# Patient Record
Sex: Female | Born: 1970
Health system: Southern US, Community
[De-identification: ages and names within clinical notes are randomized; demographics above are authoritative.]

## PROBLEM LIST (undated history)

## (undated) DIAGNOSIS — I10 Essential (primary) hypertension: Secondary | ICD-10-CM

## (undated) HISTORY — DX: Essential (primary) hypertension: I10

---

## 2013-03-26 LAB — LIPID PANEL
CHOLESTEROL: 200 mg/dL (ref 0–200)
HDL: 80 mg/dL — AB (ref 35–70)
LDL Cholesterol: 101 mg/dL
LDl/HDL Ratio: 2.5
Triglycerides: 95 mg/dL (ref 40–160)

## 2013-03-26 LAB — BASIC METABOLIC PANEL
BUN: 7 mg/dL (ref 4–21)
Creatinine: 0.8 mg/dL (ref <=1.1)
GLUCOSE: 92 mg/dL
Potassium: 4.2 mmol/L (ref 3.4–5.3)
SODIUM: 141 mmol/L (ref 137–147)

## 2013-03-26 LAB — CBC AND DIFFERENTIAL
HEMATOCRIT: 40 % (ref 36–46)
HEMOGLOBIN: 13.2 g/dL (ref 12.0–16.0)
PLATELETS: 262 10*3/uL (ref 150–399)
WBC: 3.6 10^3/mL

## 2014-01-08 ENCOUNTER — Telehealth: Payer: Self-pay | Admitting: *Deleted

## 2014-01-08 NOTE — Telephone Encounter (Signed)
Medical records received and forwarded to Jess T. JG//CMA

## 2014-01-12 ENCOUNTER — Encounter: Payer: Self-pay | Admitting: General Practice

## 2014-01-14 ENCOUNTER — Ambulatory Visit: Payer: 59 | Admitting: Family Medicine

## 2014-01-21 ENCOUNTER — Encounter: Payer: Self-pay | Admitting: Family Medicine

## 2014-01-21 ENCOUNTER — Ambulatory Visit (INDEPENDENT_AMBULATORY_CARE_PROVIDER_SITE_OTHER): Payer: 59 | Admitting: Family Medicine

## 2014-01-21 ENCOUNTER — Encounter: Payer: Self-pay | Admitting: General Practice

## 2014-01-21 VITALS — BP 170/100 | HR 94 | Temp 98.1°F | Resp 16 | Ht 62.0 in | Wt 111.0 lb

## 2014-01-21 DIAGNOSIS — I10 Essential (primary) hypertension: Secondary | ICD-10-CM

## 2014-01-21 LAB — BASIC METABOLIC PANEL
BUN: 8 mg/dL (ref 6–23)
CO2: 26 mEq/L (ref 19–32)
CREATININE: 0.8 mg/dL (ref 0.4–1.2)
Calcium: 9.6 mg/dL (ref 8.4–10.5)
Chloride: 105 mEq/L (ref 96–112)
GFR: 79.36 mL/min (ref >60.00)
Glucose, Bld: 103 mg/dL — ABNORMAL HIGH (ref 70–99)
Potassium: 4.6 mEq/L (ref 3.5–5.1)
Sodium: 139 mEq/L (ref 135–145)

## 2014-01-21 LAB — CBC WITH DIFFERENTIAL/PLATELET
BASOS ABS: 0 10*3/uL (ref 0.0–0.1)
Basophils Relative: 0.5 % (ref 0.0–3.0)
EOS ABS: 0 10*3/uL (ref 0.0–0.7)
Eosinophils Relative: 0.2 % (ref 0.0–5.0)
HEMATOCRIT: 40.9 % (ref 36.0–46.0)
HEMOGLOBIN: 13.4 g/dL (ref 12.0–15.0)
LYMPHS ABS: 1.6 10*3/uL (ref 0.7–4.0)
Lymphocytes Relative: 24.4 % (ref 12.0–46.0)
MCHC: 32.7 g/dL (ref 30.0–36.0)
MCV: 92.4 fl (ref 78.0–100.0)
MONOS PCT: 7.8 % (ref 3.0–12.0)
Monocytes Absolute: 0.5 10*3/uL (ref 0.1–1.0)
NEUTROS ABS: 4.3 10*3/uL (ref 1.4–7.7)
Neutrophils Relative %: 67.1 % (ref 43.0–77.0)
PLATELETS: 276 10*3/uL (ref 150.0–400.0)
RBC: 4.43 Mil/uL (ref 3.87–5.11)
RDW: 13.7 % (ref 11.5–15.5)
WBC: 6.4 10*3/uL (ref 4.0–10.5)

## 2014-01-21 LAB — TSH: TSH: 1.16 u[IU]/mL (ref 0.35–4.50)

## 2014-01-21 MED ORDER — METOPROLOL SUCCINATE ER 25 MG PO TB24: 25.0000 mg | ORAL_TABLET | Freq: Every day | ORAL | Status: DC

## 2014-01-21 MED ORDER — LOSARTAN POTASSIUM 50 MG PO TABS: 50.0000 mg | ORAL_TABLET | Freq: Every day | ORAL | Status: DC

## 2014-01-21 NOTE — Patient Instructions (Signed)
Follow up in 2-3 weeks to recheck BP We'll notify you of your lab results and make any changes if needed STOP the atenolol START the Losartan and Metoprolol daily Drink plenty of fluids Limit salt intake Try and exercise as able Call with any questions or concerns Welcome!  We're glad to have you!

## 2014-01-21 NOTE — Progress Notes (Signed)
Pre visit review using our clinic review tool, if applicable. No additional management support is needed unless otherwise documented below in the visit note. 

## 2014-01-21 NOTE — Progress Notes (Signed)
   Subjective:    Patient ID: Lisa Patterson, female    DOB: [birth year], 43 y.o.   MRN: [id number]  HPI New to establish.  Previous- Lisa Patterson.  HTN- chronic problem, on atenolol.  Has been checking BP at home and readings have been 'very high'.  Yesterday afternoon developed blurry vision and HA on R side.  BP was '150 something over 103'.  + dizziness.  Laid down- took 2nd dose of atenolol.  sxs improved after sleep.  + family hx of HTN.  No CP, SOB.  Intermittent edema of hands.  No dietary changes.  Pt reports she no longer exercises.  Previously on Metoprolol and Losartan   Review of Systems For ROS see HPI     Objective:   Physical Exam  Constitutional: She is oriented to person, place, and time. She appears well-developed and well-nourished. No distress.  HENT:  Head: Normocephalic and atraumatic.  Eyes: Conjunctivae and EOM are normal. Pupils are equal, round, and reactive to light.  Neck: Normal range of motion. Neck supple. No thyromegaly present.  Cardiovascular: Normal rate, regular rhythm, normal heart sounds and intact distal pulses.   No murmur heard. Pulmonary/Chest: Effort normal and breath sounds normal. No respiratory distress.  Abdominal: Soft. She exhibits no distension. There is no tenderness.  Musculoskeletal: She exhibits no edema.  Lymphadenopathy:    She has no cervical adenopathy.  Neurological: She is alert and oriented to person, place, and time.  Skin: Skin is warm and dry.  Psychiatric: She has a normal mood and affect. Her behavior is normal.  Vitals reviewed.         Assessment & Plan:

## 2014-01-24 NOTE — Assessment & Plan Note (Signed)
New to provider, ongoing for pt.  BP is not well controlled on current regimen of Atenolol.  Stop Atenolol and restart previous regimen of Metoprolol and Losartan.  Check labs to assess for baseline Cr, possible anemia, hyperthyroid.   Reviewed supportive care and red flags that should prompt return.  Pt expressed understanding and is in agreement w/ plan.

## 2014-02-03 ENCOUNTER — Ambulatory Visit: Payer: 59 | Admitting: Family Medicine

## 2014-02-08 ENCOUNTER — Ambulatory Visit (INDEPENDENT_AMBULATORY_CARE_PROVIDER_SITE_OTHER): Payer: 59 | Admitting: Medical

## 2014-02-08 ENCOUNTER — Encounter: Payer: Self-pay | Admitting: Medical

## 2014-02-08 VITALS — BP 138/80 | HR 97 | Temp 98.1°F | Ht 61.0 in | Wt 110.8 lb

## 2014-02-08 DIAGNOSIS — B001 Herpesviral vesicular dermatitis: Secondary | ICD-10-CM | POA: Insufficient documentation

## 2014-02-08 DIAGNOSIS — I10 Essential (primary) hypertension: Secondary | ICD-10-CM

## 2014-02-08 MED ORDER — FAMCICLOVIR 500 MG PO TABS: 500.0000 mg | ORAL_TABLET | Freq: Three times a day (TID) | ORAL | Status: DC

## 2014-02-08 NOTE — Progress Notes (Signed)
   Subjective:    Patient ID: Lisa Patterson, female    DOB: 1970/07/04, 43 y.o.   MRN: [id number]  HPI   Pt in for rt lower lip swollen. This morning she had slight small itch to her lip. Then this am very swollen lip. Usually she does not get this degree of  swelling. No shortness or breath or wheezing.  Pt also has high blood pressure history. Pt just on metoprolol succinate and losartan. Pt pressure still little high side. At home readings are 153/98, 147/96. Little above 140/90. No cardiac or neurologic signs or symptoms recently.   LMP- November 28 th,2015.    Review of Systems  Constitutional: Negative for fever, chills, diaphoresis, activity change and fatigue.  Respiratory: Negative for cough, chest tightness and shortness of breath.   Cardiovascular: Negative for chest pain, palpitations and leg swelling.  Gastrointestinal: Negative for nausea, vomiting and abdominal pain.  Musculoskeletal: Negative for neck pain and neck stiffness.  Skin:       Rt lower lip mild swollen. Cold sore.  Neurological: Negative for dizziness, tremors, seizures, syncope, facial asymmetry, speech difficulty, weakness, light-headedness, numbness and headaches.  Psychiatric/Behavioral: Negative for behavioral problems, confusion and agitation. The patient is not nervous/anxious.        Objective:   Physical Exam   General Mental Status- Alert. General Appearance- Not in acute distress.   Skin General: Color- Normal Color. Moisture- Normal Moisture. Rt side of lower lip- moderate swollen. 8mm scab. No tenderness to palpation  Neck Carotid Arteries- Normal color. Moisture- Normal Moisture. No carotid bruits. No JVD.  Chest and Lung Exam Auscultation: Breath Sounds:-Normal.  Cardiovascular Auscultation:Rythm- Regular. Murmurs & Other Heart Sounds:Auscultation of the heart reveals- No Murmurs.    Neurologic Cranial Nerve exam:- CN III-XII intact(No nystagmus), symmetric smile. Romberg  Exam:- Negative.  Heal to Toe Gait exam:-Normal. Strength:- 5/5 equal and symmetric strength both upper and lower extremities.       Assessment & Plan:

## 2014-02-08 NOTE — Patient Instructions (Signed)
Rx famvir today for large cold sore.  For your blood pressure. It was better when I checked compared to your at home readings. I want you to check your bp and pulse. Give us update later this week on both. If both little high then I may increase the metoprolol.Follow up in 7 days or as needed  Cold Sore A cold sore (fever blister) is a skin infection caused by the herpes simplex virus (HSV-1). HSV-1 is closely related to the virus that causes genital herpes (HSV-2), but they are not the same even though both viruses can cause oral and genital infections. Cold sores are small, fluid-filled sores inside of the mouth or on the lips, gums, nose, chin, cheeks, or fingers.  The herpes simplex virus can be easily passed (contagious) to other people through close personal contact, such as kissing or sharing personal items. The virus can also spread to other parts of the body, such as the eyes or genitals. Cold sores are contagious until the sores crust over completely. They often heal within 2 weeks.  Once a person is infected, the herpes simplex virus remains permanently in the body. Therefore, there is no cure for cold sores, and they often recur when a person is tired, stressed, sick, or gets too much sun. Additional factors that can cause a recurrence include hormone changes in menstruation or pregnancy, certain drugs, and cold weather.  CAUSES  Cold sores are caused by the herpes simplex virus. The virus is spread from person to person through close contact, such as through kissing, touching the affected area, or sharing personal items such as lip balm, razors, or eating utensils.  SYMPTOMS  The first infection may not cause symptoms. If symptoms develop, the symptoms often go through different stages. Here is how a cold sore develops:   Tingling, itching, or burning is felt 1-2 days before the outbreak.   Fluid-filled blisters appear on the lips, inside the mouth, nose, or on the cheeks.   The  blisters start to ooze clear fluid.   The blisters dry up and a yellow crust appears in its place.   The crust falls off.  Symptoms depend on whether it is the initial outbreak or a recurrence. Some other symptoms with the first outbreak may include:   Fever.   Sore throat.   Headache.   Muscle aches.   Swollen neck glands.  DIAGNOSIS  A diagnosis is often made based on your symptoms and looking at the sores. Sometimes, a sore may be swabbed and then examined in the lab to make a final diagnosis. If the sores are not present, blood tests can find the herpes simplex virus.  TREATMENT  There is no cure for cold sores and no vaccine for the herpes simplex virus. Within 2 weeks, most cold sores go away on their own without treatment. Medicines cannot make the infection go away, but medicine can help relieve some of the pain associated with the sores, can work to stop the virus from multiplying, and can also shorten healing time. Medicine may be in the form of creams, gels, pills, or a shot.  HOME CARE INSTRUCTIONS   Only take over-the-counter or prescription medicines for pain, discomfort, or fever as directed by your caregiver. Do not use aspirin.   Use a cotton-tip swab to apply creams or gels to your sores.   Do not touch the sores or pick the scabs. Wash your hands often. Do not touch your eyes without washing your hands  first.   Avoid kissing, oral sex, and sharing personal items until sores heal.   Apply an ice pack on your sores for 10-15 minutes to ease any discomfort.   Avoid hot, cold, or salty foods because they may hurt your mouth. Eat a soft, bland diet to avoid irritating the sores. Use a straw to drink if you have pain when drinking out of a glass.   Keep sores clean and dry to prevent an infection of other tissues.   Avoid the sun and limit stress if these things trigger outbreaks. If sun causes cold sores, apply sunscreen on the lips before being out in  the sun.  SEEK MEDICAL CARE IF:   You have a fever or persistent symptoms for more than 2-3 days.   You have a fever and your symptoms suddenly get worse.   You have pus, not clear fluid, coming from the sores.   You have redness that is spreading.   You have pain or irritation in your eye.   You get sores on your genitals.   Your sores do not heal within 2 weeks.   You have a weakened immune system.   You have frequent recurrences of cold sores.  MAKE SURE YOU:   Understand these instructions.  Will watch your condition.  Will get help right away if you are not doing well or get worse. Document Released: 02/17/2000 Document Revised: 07/06/2013 Document Reviewed: 07/04/2011 [facility] Patient Information 2015 [place]. This information is not intended to replace advice given to you by your health care provider. Make sure you discuss any questions you have with your health care provider.

## 2014-02-08 NOTE — Assessment & Plan Note (Signed)
For your blood pressure. It was better when I checked compared to your at home readings. I want you to check your bp and pulse. Give us update later this week on both. If both little high then I may increase the metoprolol.Follow up in 7 days or as needed

## 2014-02-08 NOTE — Assessment & Plan Note (Signed)
Rx famvir 500 mg tid x 7 days today for large cold sore.

## 2014-02-08 NOTE — Progress Notes (Signed)
Pre visit review using our clinic review tool, if applicable. No additional management support is needed unless otherwise documented below in the visit note. 

## 2014-02-10 ENCOUNTER — Ambulatory Visit (INDEPENDENT_AMBULATORY_CARE_PROVIDER_SITE_OTHER): Payer: 59 | Admitting: Family Medicine

## 2014-02-10 ENCOUNTER — Encounter: Payer: Self-pay | Admitting: Family Medicine

## 2014-02-10 VITALS — BP 136/84 | HR 87 | Resp 16 | Wt 111.1 lb

## 2014-02-10 DIAGNOSIS — I10 Essential (primary) hypertension: Secondary | ICD-10-CM

## 2014-02-10 MED ORDER — LOSARTAN POTASSIUM 100 MG PO TABS: 100.0000 mg | ORAL_TABLET | Freq: Every day | ORAL | Status: DC

## 2014-02-10 NOTE — Assessment & Plan Note (Signed)
Improved since starting meds at last visit.  Pt is now asymptomatic but continues to have elevated diastolic readings.  Increase losartan to 100mg  daily.  Recheck BMP today due to addition of ARB.  Pt expressed understanding and is in agreement w/ plan.

## 2014-02-10 NOTE — Patient Instructions (Signed)
Follow up in 1 month to recheck BP Increase the Losartan to 100mg - 2 tabs of what you have, 1 of the new prescription when you pick it up Continue the Metoprolol daily We'll notify you of your lab results and make any changes if needed Call with any questions or concerns Happy Holidays!!!

## 2014-02-10 NOTE — Progress Notes (Signed)
   Subjective:    Patient ID: Lisa Patterson, female    DOB: [birth year], 43 y.o.   MRN: [id number]  HPI HTN- much better today than last visit.  No longer having blurry vision, HAs, dizziness.  No CP, SOB, edema.  Home BPs ranging 125-151/90-103.     Review of Systems For ROS see HPI     Objective:   Physical Exam  Constitutional: She is oriented to person, place, and time. She appears well-developed and well-nourished. No distress.  HENT:  Head: Normocephalic and atraumatic.  Eyes: Conjunctivae and EOM are normal. Pupils are equal, round, and reactive to light.  Neck: Normal range of motion. Neck supple. No thyromegaly present.  Cardiovascular: Normal rate, regular rhythm, normal heart sounds and intact distal pulses.   No murmur heard. Pulmonary/Chest: Effort normal and breath sounds normal. No respiratory distress.  Abdominal: Soft. She exhibits no distension. There is no tenderness.  Musculoskeletal: She exhibits no edema.  Lymphadenopathy:    She has no cervical adenopathy.  Neurological: She is alert and oriented to person, place, and time.  Skin: Skin is warm and dry.  Psychiatric: She has a normal mood and affect. Her behavior is normal.  Vitals reviewed.         Assessment & Plan:

## 2014-02-10 NOTE — Progress Notes (Signed)
Pre visit review using our clinic review tool, if applicable. No additional management support is needed unless otherwise documented below in the visit note. 

## 2014-02-11 ENCOUNTER — Encounter: Payer: Self-pay | Admitting: General Practice

## 2014-02-11 LAB — BASIC METABOLIC PANEL
BUN: 8 mg/dL (ref 6–23)
CALCIUM: 9 mg/dL (ref 8.4–10.5)
CO2: 25 meq/L (ref 19–32)
CREATININE: 0.8 mg/dL (ref 0.4–1.2)
Chloride: 104 mEq/L (ref 96–112)
GFR: 89.05 mL/min (ref >60.00)
Glucose, Bld: 95 mg/dL (ref 70–99)
Potassium: 3.8 mEq/L (ref 3.5–5.1)
SODIUM: 136 meq/L (ref 135–145)

## 2014-02-12 ENCOUNTER — Encounter: Payer: Self-pay | Admitting: General Practice

## 2014-02-22 ENCOUNTER — Telehealth: Payer: Self-pay | Admitting: Family Medicine

## 2014-02-22 MED ORDER — METOPROLOL SUCCINATE ER 25 MG PO TB24: 25.0000 mg | ORAL_TABLET | Freq: Every day | ORAL | Status: DC

## 2014-02-22 NOTE — Telephone Encounter (Signed)
Med filled.  

## 2014-02-22 NOTE — Telephone Encounter (Signed)
metroprolol 25 mg refill to [facility]

## 2014-03-30 ENCOUNTER — Encounter: Payer: Self-pay | Admitting: Medical

## 2014-03-30 ENCOUNTER — Ambulatory Visit (INDEPENDENT_AMBULATORY_CARE_PROVIDER_SITE_OTHER): Payer: 59 | Admitting: Medical

## 2014-03-30 VITALS — BP 132/97 | HR 80 | Temp 98.3°F | Ht 61.0 in | Wt 112.4 lb

## 2014-03-30 DIAGNOSIS — J069 Acute upper respiratory infection, unspecified: Secondary | ICD-10-CM

## 2014-03-30 MED ORDER — AZITHROMYCIN 250 MG PO TABS: ORAL_TABLET | ORAL | Status: DC

## 2014-03-30 MED ORDER — HYDROCODONE-HOMATROPINE 5-1.5 MG/5ML PO SYRP: 5.0000 mL | ORAL_SOLUTION | Freq: Three times a day (TID) | ORAL | Status: DC | PRN

## 2014-03-30 MED ORDER — FLUTICASONE PROPIONATE 50 MCG/ACT NA SUSP: 2.0000 | Freq: Every day | NASAL | Status: DC

## 2014-03-30 NOTE — Patient Instructions (Addendum)
You appear to have upper respiratory infection with possible early sinusitis. I am prescribing hydromet for cough and flonase for nasal congestion. If you sinus pressure persists or bronchitis type symptoms occur then start azithromycin.  Follow up 7 days or as needed.

## 2014-03-30 NOTE — Assessment & Plan Note (Signed)
You appear to have upper respiratory infection with possible early sinusitis. I am prescribing hydromet for cough and flonase for nasal congestion. If you sinus pressure persists or bronchitis type symptoms occur then start azithromycin.  Follow up 7 days or as needed. 

## 2014-03-30 NOTE — Progress Notes (Signed)
Subjective:    Patient ID: Lisa Patterson, female    DOB: 26-Sep-1970, 44 y.o.   MRN: [id number]  HPI   Pt in today reporting cough, nasal congestion and runny nose for  3 Days. Moderate symptoms to severe first 2 days. Today she felt like she was getting better. 1st day body aches. Faint now.  Symptoms started Sunday at night. On review symptoms do not feel flu like per pt.  Associated symptoms( below yes or no)  Fever-no Chills-yes Chest congestion-No Sneezing- yes Itching eyes-no Sore throat- yes Post-nasal drainage-yes Wheezing-No Purulent  Nasal drainage-yes Fatigue-yes  Faint sinus pressure. No ear pain.  Tylenol sinus otc.  Past Medical History  Diagnosis Date  . Hypertension     History   Social History  . Marital Status: Married    Spouse Name: N/A    Number of Children: N/A  . Years of Education: N/A   Occupational History  . Not on file.   Social History Main Topics  . Smoking status: Never Smoker   . Smokeless tobacco: Not on file  . Alcohol Use: Not on file  . Drug Use: Not on file  . Sexual Activity: Not on file   Other Topics Concern  . Not on file   Social History Narrative    No past surgical history on file.  Family History  Problem Relation Age of Onset  . Mental illness Mother   . Hyperlipidemia Father   . Hypertension Father   . Diabetes Father   . Cancer Brother     lung  . Cancer Maternal Aunt     breast  . Diabetes Maternal Grandmother   . Cancer Maternal Grandfather     lung  . Diabetes Paternal Grandmother     No Known Allergies  Current Outpatient Prescriptions on File Prior to Visit  Medication Sig Dispense Refill  . ASPIRIN EC PO Take by mouth.    . losartan (COZAAR) 100 MG tablet Take 1 tablet (100 mg total) by mouth daily. 90 tablet 3  . metoprolol succinate (TOPROL-XL) 25 MG 24 hr tablet Take 1 tablet (25 mg total) by mouth daily. 30 tablet 3  . famciclovir (FAMVIR) 500 MG tablet Take 1 tablet (500 mg  total) by mouth 3 (three) times daily. (Patient not taking: Reported on 03/30/2014) 21 tablet 0   No current facility-administered medications on file prior to visit.    BP 132/97 mmHg  Pulse 80  Temp(Src) 98.3 F (36.8 C) (Oral)  Ht 5\' 1"  (1.549 m)  Wt 112 lb 6.4 oz (50.984 kg)  BMI 21.25 kg/m2  SpO2 99%  LMP 02/22/2014        Review of Systems  Constitutional: Positive for chills. Negative for fever and fatigue.  HENT: Positive for congestion, sinus pressure and sneezing. Negative for sore throat.        Sinus pressure. Minimal faint sinus pressure.  Respiratory: Positive for cough. Negative for apnea, chest tightness, shortness of breath and wheezing.   Cardiovascular: Negative for chest pain and palpitations.  Gastrointestinal: Negative for abdominal pain.  Genitourinary: Negative for dysuria, frequency, flank pain and dyspareunia.  Musculoskeletal: Negative for back pain, arthralgias and neck pain.  Neurological: Negative for headaches.  Hematological: Negative for adenopathy. Does not bruise/bleed easily.       Objective:   Physical Exam   General  Mental Status - Alert. General Appearance - Well groomed. Not in acute distress.  Skin Rashes- No Rashes.  HEENT Head- Normal.  Ear Auditory Canal - Left- Normal. Right - Normal.Tympanic Membrane- Left- Normal. Right- Normal. Eye Sclera/Conjunctiva- Left- Normal. Right- Normal. Nose & Sinuses Nasal Mucosa- Left-  Boggy and Congested. Right-  Boggy and  Congested.Bilateral maxillary and frontal sinus pressure. Mouth & Throat Lips: Upper Lip- Normal: no dryness, cracking, pallor, cyanosis, or vesicular eruption. Lower Lip-Normal: no dryness, cracking, pallor, cyanosis or vesicular eruption. Buccal Mucosa- Bilateral- No Aphthous ulcers. Oropharynx- No Discharge or Erythema. Tonsils: Characteristics- Bilateral- No Erythema or Congestion. Size/Enlargement- Bilateral- No enlargement. Discharge-  bilateral-None.  Neck Neck- Supple. No Masses.   Chest and Lung Exam Auscultation: Breath Sounds:-Clear even and unlabored.  Cardiovascular Auscultation:Rythm- Regular, rate and rhythm. Murmurs & Other Heart Sounds:Ausculatation of the heart reveal- No Murmurs.  Lymphatic Head & Neck General Head & Neck Lymphatics: Bilateral: Description- No Localized lymphadenopathy.         Assessment & Plan:

## 2014-03-30 NOTE — Progress Notes (Signed)
Pre visit review using our clinic review tool, if applicable. No additional management support is needed unless otherwise documented below in the visit note. 

## 2014-04-08 ENCOUNTER — Other Ambulatory Visit: Payer: Self-pay | Admitting: Family Medicine

## 2014-04-08 ENCOUNTER — Ambulatory Visit (INDEPENDENT_AMBULATORY_CARE_PROVIDER_SITE_OTHER): Payer: 59 | Admitting: Family Medicine

## 2014-04-08 ENCOUNTER — Encounter: Payer: Self-pay | Admitting: Family Medicine

## 2014-04-08 VITALS — BP 122/80 | HR 88 | Temp 98.1°F | Resp 16 | Wt 112.1 lb

## 2014-04-08 DIAGNOSIS — N63 Unspecified lump in breast: Secondary | ICD-10-CM

## 2014-04-08 DIAGNOSIS — N6310 Unspecified lump in the right breast, unspecified quadrant: Secondary | ICD-10-CM | POA: Insufficient documentation

## 2014-04-08 NOTE — Assessment & Plan Note (Signed)
New.  Mass has been present x6-8 weeks w/o fluctuating in size.  + TTP.  Get imaging to assess.  Pt expressed understanding and is in agreement w/ plan.

## 2014-04-08 NOTE — Progress Notes (Signed)
   Subjective:    Patient ID: Lisa Patterson, female    DOB: 02-Nov-1970, 44 y.o.   MRN: [id number]  HPI Breast mass- pt felt a lump in the R breast.  Had felt it previously but it always disappeared after menses.  Pt reports it has now been present x6-8 weeks.  Area is uncomfortable and worse during menses.  Maternal Aunt w/ breast cancer.  No masses in L.   Review of Systems For ROS see HPI     Objective:   Physical Exam  Constitutional: She appears well-developed and well-nourished. No distress.  Pulmonary/Chest: Right breast exhibits mass (2 cm mass at areolar margin between 12-1 o'clock). Right breast exhibits no inverted nipple, no nipple discharge, no skin change and no tenderness. Left breast exhibits no inverted nipple, no mass, no nipple discharge, no skin change and no tenderness.    Vitals reviewed.         Assessment & Plan:

## 2014-04-08 NOTE — Progress Notes (Signed)
Pre visit review using our clinic review tool, if applicable. No additional management support is needed unless otherwise documented below in the visit note. 

## 2014-04-08 NOTE — Patient Instructions (Signed)
We'll call you with your mammo appt Try not to stress- we will figure this out! Call with any questions or concerns Hang in there!

## 2014-04-15 ENCOUNTER — Ambulatory Visit
Admission: RE | Admit: 2014-04-15 | Discharge: 2014-04-15 | Disposition: A | Discharge status: Home / Self Care | Payer: 59 | Source: Ambulatory Visit | Attending: Family Medicine | Admitting: Family Medicine

## 2014-04-15 DIAGNOSIS — N6310 Unspecified lump in the right breast, unspecified quadrant: Secondary | ICD-10-CM

## 2014-06-07 ENCOUNTER — Telehealth: Payer: Self-pay | Admitting: Family Medicine

## 2014-06-07 MED ORDER — LOSARTAN POTASSIUM 100 MG PO TABS: 100.0000 mg | ORAL_TABLET | Freq: Every day | ORAL | Status: DC

## 2014-06-07 MED ORDER — METOPROLOL SUCCINATE ER 25 MG PO TB24: 25.0000 mg | ORAL_TABLET | Freq: Every day | ORAL | Status: DC

## 2014-06-07 NOTE — Addendum Note (Signed)
Addended by: Jackson LatinoYLER, JESSICA L on: 06/07/2014 01:04 PM   Modules accepted: Orders

## 2014-06-07 NOTE — Telephone Encounter (Signed)
Meds filled

## 2014-06-07 NOTE — Telephone Encounter (Signed)
Valentina LucksMatos, Jazmaine 12-01-14 Pt states she needs a refill on her Losartan and Metoprolol meds.  [facility] uses [facility] Pt can be reached at [phone]    [facility] [address] [employer] [address]

## 2014-06-15 ENCOUNTER — Ambulatory Visit (INDEPENDENT_AMBULATORY_CARE_PROVIDER_SITE_OTHER): Payer: 59 | Admitting: Physician Assistant

## 2014-06-15 ENCOUNTER — Encounter: Payer: Self-pay | Admitting: Physician Assistant

## 2014-06-15 VITALS — BP 130/78 | HR 82 | Temp 98.1°F | Resp 12

## 2014-06-15 DIAGNOSIS — I10 Essential (primary) hypertension: Secondary | ICD-10-CM

## 2014-06-15 DIAGNOSIS — R209 Unspecified disturbances of skin sensation: Secondary | ICD-10-CM | POA: Diagnosis not present

## 2014-06-15 NOTE — Assessment & Plan Note (Signed)
With endorsed swelling. None visible on exam.  Etiology unclear -- does not seem consistent with worrisome dx like bell palsy. Seems more likely first symptoms of cold sore or slight allergic reaction.  Supportive measures discussed. Avoid potential allergens discussed.  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

## 2014-06-15 NOTE — Progress Notes (Signed)
Patient presents to clinic today c/o some tingling at corner of lip this morning associated with mild puffiness.  Denies change to lotion, soaps or detergents.  Did have a small headache yesterday that has resolved.  Is being treated for HTN with losartan and Toprol-Xl daily.  Endorses taking medication as directed. Patient denies chest pain, palpitations, lightheadedness, dizziness, vision changes. Has been well controlled on current regimen for quite some time. 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   Past Medical History  Diagnosis Date  . Hypertension     Current Outpatient Prescriptions on File Prior to Visit  Medication Sig Dispense Refill  . ASPIRIN EC PO Take by mouth.    Marland Kitchen azithromycin (ZITHROMAX) 250 MG tablet Take 2 tablets by mouth on day 1, followed by 1 tablet by mouth daily for 4 days. 6 tablet 0  . famciclovir (FAMVIR) 500 MG tablet Take 1 tablet (500 mg total) by mouth 3 (three) times daily. 21 tablet 0  . fluticasone (FLONASE) 50 MCG/ACT nasal spray Place 2 sprays into both nostrils daily. 16 g 1  . HYDROcodone-homatropine (HYCODAN) 5-1.5 MG/5ML syrup Take 5 mLs by mouth every 8 (eight) hours as needed for cough. 120 mL 0  . losartan (COZAAR) 100 MG tablet Take 1 tablet (100 mg total) by mouth daily. 90 tablet 1  . metoprolol succinate (TOPROL-XL) 25 MG 24 hr tablet Take 1 tablet (25 mg total) by mouth daily. 90 tablet 1   No current facility-administered medications on file prior to visit.    No Known Allergies  Family History  Problem Relation Age of Onset  . Mental illness Mother   . Hyperlipidemia Father   . Hypertension Father   . Diabetes Father   . Cancer Brother     lung  . Cancer Maternal Aunt     breast  . Diabetes Maternal Grandmother   . Cancer Maternal Grandfather     lung  . Diabetes Paternal Grandmother     History   Social History  . Marital Status: Married    Spouse Name: N/A  . Number of Children: N/A  . Years of  Education: N/A   Social History Main Topics  . Smoking status: Never Smoker   . Smokeless tobacco: Not on file  . Alcohol Use: Not on file  . Drug Use: Not on file  . Sexual Activity: Not on file   Other Topics Concern  . None   Social History Narrative   Review of Systems - See HPI.  All other ROS are negative.  BP 130/78 mmHg  Pulse 82  Temp(Src) 98.1 F (36.7 C) (Oral)  Resp 12  SpO2 97%  Physical Exam  Constitutional: She is oriented to person, place, and time and well-developed, well-nourished, and in no distress.  HENT:  Head: Normocephalic and atraumatic.  Right Ear: External ear normal.  Left Ear: External ear normal.  Nose: Nose normal.  Mouth/Throat: Oropharynx is clear and moist. No oropharyngeal exudate.  TM within normal limits bilaterally.  Eyes: Conjunctivae are normal.  Neck: Neck supple.  Cardiovascular: Normal rate, regular rhythm, normal heart sounds and intact distal pulses.   Pulmonary/Chest: Effort normal and breath sounds normal. No respiratory distress. She has no wheezes. She has no rales. She exhibits no tenderness.  Neurological: She is alert and oriented to person, place, and time.  Skin: Skin is warm and dry. No rash noted.  Psychiatric: Affect normal.  Vitals reviewed.  Assessment/Plan: Sensation disturbance of  skin With endorsed swelling. None visible on exam.  Etiology unclear -- does not seem consistent with worrisome dx like bell palsy. Seems more likely first symptoms of cold sore or slight allergic reaction.  Supportive measures discussed. Avoid potential allergens discussed.  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   HTN (hypertension) BP normotensive in clinic.  Do not think swelling noted this am was related to ARB.  Encouraged her to hold ARB in the morning before reassessment.

## 2014-06-15 NOTE — Patient Instructions (Signed)
Your exam looked good today.  Only minimal swelling noted.  Really watch what you are eating and lotions you are using to make sure there is not a developing allergic reaction.  Also there is a small chance that the losartan could be contributing. I would encourage you to hold off your losartan tomorrow morning and we will reassess your symptoms tomorrow.

## 2014-06-15 NOTE — Assessment & Plan Note (Signed)
BP normotensive in clinic.  Do not think swelling noted this am was related to ARB.  Encouraged her to hold ARB in the morning before reassessment.

## 2014-11-02 ENCOUNTER — Encounter: Payer: Self-pay | Admitting: Family Medicine

## 2014-11-02 ENCOUNTER — Ambulatory Visit (INDEPENDENT_AMBULATORY_CARE_PROVIDER_SITE_OTHER): Payer: 59 | Admitting: Family Medicine

## 2014-11-02 VITALS — BP 124/80 | HR 83 | Temp 98.0°F | Resp 16 | Ht 61.0 in | Wt 115.2 lb

## 2014-11-02 DIAGNOSIS — T783XXA Angioneurotic edema, initial encounter: Secondary | ICD-10-CM | POA: Diagnosis not present

## 2014-11-02 DIAGNOSIS — Z0000 Encounter for general adult medical examination without abnormal findings: Secondary | ICD-10-CM | POA: Diagnosis not present

## 2014-11-02 DIAGNOSIS — I10 Essential (primary) hypertension: Secondary | ICD-10-CM | POA: Diagnosis not present

## 2014-11-02 DIAGNOSIS — Z23 Encounter for immunization: Secondary | ICD-10-CM

## 2014-11-02 DIAGNOSIS — Z01419 Encounter for gynecological examination (general) (routine) without abnormal findings: Secondary | ICD-10-CM

## 2014-11-02 LAB — HEPATIC FUNCTION PANEL
ALK PHOS: 71 U/L (ref 39–117)
ALT: 9 U/L (ref 0–35)
AST: 14 U/L (ref 0–37)
Albumin: 4.3 g/dL (ref 3.5–5.2)
BILIRUBIN TOTAL: 1.4 mg/dL — AB (ref 0.2–1.2)
Bilirubin, Direct: 0.2 mg/dL (ref 0.0–0.3)
Total Protein: 7.4 g/dL (ref 6.0–8.3)

## 2014-11-02 LAB — CBC WITH DIFFERENTIAL/PLATELET
BASOS PCT: 0.5 % (ref 0.0–3.0)
Basophils Absolute: 0 10*3/uL (ref 0.0–0.1)
EOS PCT: 1.4 % (ref 0.0–5.0)
Eosinophils Absolute: 0.1 10*3/uL (ref 0.0–0.7)
HCT: 38.6 % (ref 36.0–46.0)
Hemoglobin: 12.9 g/dL (ref 12.0–15.0)
LYMPHS ABS: 1.8 10*3/uL (ref 0.7–4.0)
Lymphocytes Relative: 35.2 % (ref 12.0–46.0)
MCHC: 33.3 g/dL (ref 30.0–36.0)
MCV: 90.9 fl (ref 78.0–100.0)
MONOS PCT: 7.6 % (ref 3.0–12.0)
Monocytes Absolute: 0.4 10*3/uL (ref 0.1–1.0)
NEUTROS ABS: 2.8 10*3/uL (ref 1.4–7.7)
NEUTROS PCT: 55.3 % (ref 43.0–77.0)
PLATELETS: 244 10*3/uL (ref 150.0–400.0)
RBC: 4.24 Mil/uL (ref 3.87–5.11)
RDW: 13.4 % (ref 11.5–15.5)
WBC: 5.1 10*3/uL (ref 4.0–10.5)

## 2014-11-02 LAB — BASIC METABOLIC PANEL
BUN: 5 mg/dL — ABNORMAL LOW (ref 6–23)
CO2: 26 mEq/L (ref 19–32)
Calcium: 8.9 mg/dL (ref 8.4–10.5)
Chloride: 105 mEq/L (ref 96–112)
Creatinine, Ser: 0.66 mg/dL (ref 0.40–1.20)
GFR: 104.43 mL/min (ref >60.00)
Glucose, Bld: 85 mg/dL (ref 70–99)
POTASSIUM: 3.6 meq/L (ref 3.5–5.1)
SODIUM: 136 meq/L (ref 135–145)

## 2014-11-02 LAB — VITAMIN D 25 HYDROXY (VIT D DEFICIENCY, FRACTURES): VITD: 17.4 ng/mL — ABNORMAL LOW (ref 30.00–100.00)

## 2014-11-02 LAB — LIPID PANEL
CHOL/HDL RATIO: 3
CHOLESTEROL: 196 mg/dL (ref 0–200)
HDL: 67.8 mg/dL (ref >39.00)
LDL Cholesterol: 105 mg/dL — ABNORMAL HIGH (ref 0–99)
NonHDL: 128.2
Triglycerides: 117 mg/dL (ref 0.0–149.0)
VLDL: 23.4 mg/dL (ref 0.0–40.0)

## 2014-11-02 LAB — TSH: TSH: 1.85 u[IU]/mL (ref 0.35–4.50)

## 2014-11-02 MED ORDER — METOPROLOL SUCCINATE ER 25 MG PO TB24: 25.0000 mg | ORAL_TABLET | Freq: Every day | ORAL | Status: DC

## 2014-11-02 MED ORDER — LOSARTAN POTASSIUM 100 MG PO TABS: 100.0000 mg | ORAL_TABLET | Freq: Every day | ORAL | Status: DC

## 2014-11-02 MED ORDER — AMLODIPINE BESYLATE 5 MG PO TABS: 5.0000 mg | ORAL_TABLET | Freq: Every day | ORAL | Status: DC

## 2014-11-02 NOTE — Assessment & Plan Note (Signed)
Chronic problem.  Pt having angioedema w/ ARB use.  D/c ARB.  Start Amlodipine .  Will continue to monitor closely.  Pt expressed understanding and is in agreement w/ plan.

## 2014-11-02 NOTE — Patient Instructions (Signed)
Follow up in 3-4 weeks to recheck BP We'll notify you of your lab results and make any changes if needed STOP the Losartan Tomorrow, start the Amlodipine and continue the Metoprolol Take the Allegra tonight before bed and then hold off on daily allegra unless you are having symptoms of swelling If you have swelling and you are unable to breathe, please go to the ER immediately (call [phone]!) Call with any questions or concerns Happy Labor Day!

## 2014-11-02 NOTE — Assessment & Plan Note (Signed)
Pt's PE WNL.  UTD on mammo.  Due for pap- referral to GYN placed.  Check labs.  Anticipatory guidance provided.

## 2014-11-02 NOTE — Progress Notes (Signed)
Pre visit review using our clinic review tool, if applicable. No additional management support is needed unless otherwise documented below in the visit note. 

## 2014-11-02 NOTE — Progress Notes (Signed)
   Subjective:    Patient ID: Lisa Patterson, female    DOB: Nov 12, 1970, 44 y.o.   MRN: [id number]  HPI CPE- UTD on mammo, due for GYN- needs referral.  Pt reports she has had 5 months of intermittent facial swelling.  Occuring mostly at night.  Resolves w/ Allegra.  Takes Losartan daily in the AM.  No changes in face cream, detergents   Review of Systems Patient reports no vision/ hearing changes, adenopathy,fever, weight change,  persistant/recurrent hoarseness , swallowing issues, chest pain, palpitations, edema, persistant/recurrent cough, hemoptysis, dyspnea (rest/exertional/paroxysmal nocturnal), gastrointestinal bleeding (melena, rectal bleeding), abdominal pain, significant heartburn, bowel changes, GU symptoms (dysuria, hematuria, incontinence), Gyn symptoms (abnormal  bleeding, pain),  syncope, focal weakness, memory loss, numbness & tingling, skin/hair/nail changes, abnormal bruising or bleeding, anxiety, or depression.     Objective:   Physical Exam General Appearance:    Alert, cooperative, no distress, appears stated age  Head:    Normocephalic, without obvious abnormality, atraumatic  Eyes:    PERRL, conjunctiva/corneas clear, EOM's intact, fundi    benign, both eyes  Ears:    Normal TM's and external ear canals, both ears  Nose:   Nares normal, septum midline, mucosa normal, no drainage    or sinus tenderness  Throat:   Lips, mucosa, and tongue normal; teeth and gums normal  Neck:   Supple, symmetrical, trachea midline, no adenopathy;    Thyroid: no enlargement/tenderness/nodules  Back:     Symmetric, no curvature, ROM normal, no CVA tenderness  Lungs:     Clear to auscultation bilaterally, respirations unlabored  Chest Wall:    No tenderness or deformity   Heart:    Regular rate and rhythm, S1 and S2 normal, no murmur, rub   or gallop  Breast Exam:    Deferred to GYN  Abdomen:     Soft, non-tender, bowel sounds active all four quadrants,    no masses, no organomegaly    Genitalia:    Deferred to GYN  Rectal:    Extremities:   Extremities normal, atraumatic, no cyanosis or edema  Pulses:   2+ and symmetric all extremities  Skin:   Skin color, texture, turgor normal, no rashes or lesions  Lymph nodes:   Cervical, supraclavicular, and axillary nodes normal  Neurologic:   CNII-XII intact, normal strength, sensation and reflexes    throughout          Assessment & Plan:

## 2014-11-02 NOTE — Assessment & Plan Note (Signed)
New.  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  Pt already took ARB today so she is to take Allegra prior to bed tonight.  Pt to DC ARB and monitor for improvement in sxs.  If sxs persist, she will need referral to allergy.  Reviewed supportive care and red flags that should prompt return.  Pt expressed understanding and is in agreement w/ plan.

## 2014-11-03 ENCOUNTER — Encounter: Payer: Self-pay | Admitting: General Practice

## 2014-11-03 ENCOUNTER — Other Ambulatory Visit: Payer: Self-pay | Admitting: General Practice

## 2014-11-03 MED ORDER — VITAMIN D (ERGOCALCIFEROL) 1.25 MG (50000 UNIT) PO CAPS: 50000.0000 [IU] | ORAL_CAPSULE | ORAL | Status: DC

## 2014-11-12 ENCOUNTER — Ambulatory Visit (INDEPENDENT_AMBULATORY_CARE_PROVIDER_SITE_OTHER): Payer: 59 | Admitting: Family Medicine

## 2014-11-12 ENCOUNTER — Encounter: Payer: Self-pay | Admitting: Family Medicine

## 2014-11-12 VITALS — BP 128/80 | HR 75 | Temp 98.5°F | Wt 113.2 lb

## 2014-11-12 DIAGNOSIS — T783XXD Angioneurotic edema, subsequent encounter: Secondary | ICD-10-CM

## 2014-11-12 MED ORDER — METHYLPREDNISOLONE ACETATE 80 MG/ML IJ SUSP
80.0000 mg | Freq: Once | INTRAMUSCULAR | Status: AC
  Administered 2014-11-12: 80 mg via INTRAMUSCULAR

## 2014-11-12 MED ORDER — EPINEPHRINE 0.3 MG/0.3ML IJ SOAJ: 0.3000 mg | Freq: Once | INTRAMUSCULAR | Status: DC

## 2014-11-12 MED ORDER — PREDNISONE 10 MG PO TABS
ORAL_TABLET | ORAL | Status: DC
Start: 2014-11-12 — End: 2014-12-01

## 2014-11-12 NOTE — Assessment & Plan Note (Signed)
Seems to be worsening each time Depo medrol given in office con't allegra Allergy referral

## 2014-11-12 NOTE — Patient Instructions (Signed)
Angioedema °Angioedema is a sudden swelling of tissues, often of the skin. It can occur on the face or genitals or in the abdomen or other body parts. The swelling usually develops over a short period and gets better in 24 to 48 hours. It often begins during the night and is found when the person wakes up. The person may also get red, itchy patches of skin (hives). Angioedema can be dangerous if it involves swelling of the air passages.  °Depending on the cause, episodes of angioedema may only happen once, come back in unpredictable patterns, or repeat for several years and then gradually fade away.  °CAUSES  °Angioedema can be caused by an allergic reaction to various triggers. It can also result from nonallergic causes, including reactions to drugs, immune system disorders, viral infections, or an abnormal gene that is passed to you from your parents (hereditary). For some people with angioedema, the cause is unknown.  °Some things that can trigger angioedema include:  °· Foods.   °· Medicines, such as ACE inhibitors, ARBs, nonsteroidal anti-inflammatory agents, or estrogen.   °· Latex.   °· Animal saliva.   °· Insect stings.   °· Dyes used in X-rays.   °· Mild injury.   °· Dental work. °· Surgery. °· Stress.   °· Sudden changes in temperature.   °· Exercise. °SIGNS AND SYMPTOMS  °· Swelling of the skin. °· Hives. If these are present, there is also intense itching. °· Redness in the affected area.   °· Pain in the affected area. °· Swollen lips or tongue. °· Breathing problems. This may happen if the air passages swell. °· Wheezing. °If internal organs are involved, there may be:  °· Nausea.   °· Abdominal pain.   °· Vomiting.   °· Difficulty swallowing.   °· Difficulty passing urine. °DIAGNOSIS  °· Your health care provider will examine the affected area and take a medical and family history. °· Various tests may be done to help determine the cause. Tests may include: °¨ Allergy skin tests to see if the problem  is an allergic reaction.   °¨ Blood tests to check for hereditary angioedema.   °¨ Tests to check for underlying diseases that could cause the condition.   °· A review of your medicines, including over-the-counter medicines, may be done. °TREATMENT  °Treatment will depend on the cause of the angioedema. Possible treatments include:  °· Removal of anything that triggered the condition (such as stopping certain medicines).   °· Medicines to treat symptoms or prevent attacks. Medicines given may include:   °¨ Antihistamines.   °¨ Epinephrine injection.   °¨ Steroids.   °· Hospitalization may be required for severe attacks. If the air passages are affected, it can be an emergency. Tubes may need to be placed to keep the airway open. °HOME CARE INSTRUCTIONS  °· Take all medicines as directed by your health care provider. °· If you were given medicines for emergency allergy treatment, always carry them with you. °· Wear a medical bracelet as directed by your health care provider.   °· Avoid known triggers. °SEEK MEDICAL CARE IF:  °· You have repeat attacks of angioedema.   °· Your attacks are more frequent or more severe despite preventive measures.   °· You have hereditary angioedema and are considering having children. It is important to discuss with your health care provider the risks of passing the condition on to your children. °SEEK IMMEDIATE MEDICAL CARE IF:  °· You have severe swelling of the mouth, tongue, or lips. °· You have difficulty breathing.   °· You have difficulty swallowing.   °· You faint. °MAKE   SURE YOU: °· Understand these instructions. °· Will watch your condition. °· Will get help right away if you are not doing well or get worse. °Document Released: 04/30/2001 Document Revised: 07/06/2013 Document Reviewed: 10/13/2012 °ExitCare® Patient Information ©2015 [employer]. This information is not intended to replace advice given to you by your health care provider. Make sure you discuss any questions  you have with your health care provider. ° °

## 2014-11-12 NOTE — Progress Notes (Signed)
Pre visit review using our clinic review tool, if applicable. No additional management support is needed unless otherwise documented below in the visit note. 

## 2014-11-12 NOTE — Progress Notes (Signed)
Patient ID: Lisa Patterson, female    DOB: 1970-11-24  Age: 44 y.o. MRN: [id number]    Subjective:  Subjective HPI Lisa Patterson presents for angioedema.    This has occurred off and on for 6 months.  She has been seen 2 other times.   It was thought to be losartan so it was changed on 8/30.   Last night she got home and it occurred again.  Itchy throat and swollen bottom lip and r eye swelled.  She also broke out on legs b/l with hives.    Review of Systems  Constitutional: Negative for diaphoresis, appetite change, fatigue and unexpected weight change.  Eyes: Negative for pain, redness and visual disturbance.  Respiratory: Negative for cough, chest tightness, shortness of breath and wheezing.   Cardiovascular: Negative for chest pain, palpitations and leg swelling.  Endocrine: Negative for cold intolerance, heat intolerance, polydipsia, polyphagia and polyuria.  Genitourinary: Negative for dysuria, frequency and difficulty urinating.  Skin: Positive for rash. Negative for color change.  Neurological: Negative for dizziness, light-headedness, numbness and headaches.    History Past Medical History  Diagnosis Date  . Hypertension     She has no past surgical history on file.   Her family history includes Cancer in her brother, maternal aunt, and maternal grandfather; Diabetes in her father, maternal grandmother, and paternal grandmother; Hyperlipidemia in her father; Hypertension in her father; Mental illness in her mother.She reports that she has never smoked. She does not have any smokeless tobacco history on file. Her alcohol and drug histories are not on file.  Current Outpatient Prescriptions on File Prior to Visit  Medication Sig Dispense Refill  . amLODipine (NORVASC) 5 MG tablet Take 1 tablet (5 mg total) by mouth daily. 90 tablet 3  . ASPIRIN EC PO Take by mouth.    . metoprolol succinate (TOPROL-XL) 25 MG 24 hr tablet Take 1 tablet (25 mg total) by mouth daily. 90 tablet 1    . Vitamin D, Ergocalciferol, (DRISDOL) 50000 UNITS CAPS capsule Take 1 capsule (50,000 Units total) by mouth every 7 (seven) days. 12 capsule 0   No current facility-administered medications on file prior to visit.     Objective:  Objective Physical Exam  Constitutional: She is oriented to person, place, and time. She appears well-developed and well-nourished.  HENT:  Head: Normocephalic and atraumatic.  Mouth/Throat: No posterior oropharyngeal edema or posterior oropharyngeal erythema.    Eyes: Conjunctivae and EOM are normal.    Neck: Normal range of motion. Neck supple. No JVD present. Carotid bruit is not present. No thyromegaly present.  Cardiovascular: Normal rate, regular rhythm and normal heart sounds.   No murmur heard. Pulmonary/Chest: Effort normal and breath sounds normal. No respiratory distress. She has no wheezes. She has no rales. She exhibits no tenderness.  Musculoskeletal: She exhibits no edema.  Neurological: She is alert and oriented to person, place, and time.  Skin: Rash noted.  Psychiatric: She has a normal mood and affect. Her behavior is normal.   BP 128/80 mmHg  Pulse 75  Temp(Src) 98.5 F (36.9 C) (Oral)  Wt 113 lb 3.2 oz (51.347 kg)  SpO2 99%  LMP 11/04/2014 Wt Readings from Last 3 Encounters:  11/12/14 113 lb 3.2 oz (51.347 kg)  11/02/14 115 lb 4 oz (52.277 kg)  04/08/14 112 lb 2 oz (50.86 kg)     Lab Results  Component Value Date   WBC 5.1 11/02/2014   HGB 12.9 11/02/2014   HCT 38.6 11/02/2014  PLT 244.0 11/02/2014   GLUCOSE 85 11/02/2014   CHOL 196 11/02/2014   TRIG 117.0 11/02/2014   HDL 67.80 11/02/2014   LDLCALC 105* 11/02/2014   ALT 9 11/02/2014   AST 14 11/02/2014   NA 136 11/02/2014   K 3.6 11/02/2014   CL 105 11/02/2014   CREATININE 0.66 11/02/2014   BUN 5* 11/02/2014   CO2 26 11/02/2014   TSH 1.85 11/02/2014    Mm Digital Diagnostic Bilat  04/15/2014   CLINICAL DATA:  Mass felt by the patient in the 12 o'clock  periareolar region of the right breast.  EXAM: DIGITAL DIAGNOSTIC BILATERAL MAMMOGRAM WITH CAD  ULTRASOUND RIGHT BREAST  COMPARISON:  None.  ACR Breast Density Category c: The breast tissue is heterogeneously dense, which may obscure small masses.  FINDINGS: Mammographic views of both breasts were obtained, including a spot tangential view of the right breast in the region of palpable abnormality. There is slightly more dense heterogeneous glandular tissue in the superior aspect of the right breast compared to the left. No discrete mass is visualized. No findings on the left suspicious for malignancy.  Mammographic images were processed with CAD.  On physical exam, the patient has an approximately 1.5 cm oval, circumscribed, mobile, palpable mass in the 12 o'clock position of the right breast, 2 cm from the nipple.  Targeted ultrasound is performed, showing a 2.2 cm trilobed cyst containing thin internal septations in the 12 o'clock position of the right breast, 2 cm from the nipple.  IMPRESSION: Right breast cyst.  No evidence of malignancy.  RECOMMENDATION: Bilateral screening mammogram in 1 year.  I have discussed the findings and recommendations with the patient. Results were also provided in writing at the conclusion of the visit. If applicable, a reminder letter will be sent to the patient regarding the next appointment.  BI-RADS CATEGORY  2: Benign.   Electronically Signed   By: Beckie Salts M.D.   On: 04/15/2014 15:11   US Breast Ltd Uni Right Inc Axilla  04/15/2014   CLINICAL DATA:  Mass felt by the patient in the 12 o'clock periareolar region of the right breast.  EXAM: DIGITAL DIAGNOSTIC BILATERAL MAMMOGRAM WITH CAD  ULTRASOUND RIGHT BREAST  COMPARISON:  None.  ACR Breast Density Category c: The breast tissue is heterogeneously dense, which may obscure small masses.  FINDINGS: Mammographic views of both breasts were obtained, including a spot tangential view of the right breast in the region of  palpable abnormality. There is slightly more dense heterogeneous glandular tissue in the superior aspect of the right breast compared to the left. No discrete mass is visualized. No findings on the left suspicious for malignancy.  Mammographic images were processed with CAD.  On physical exam, the patient has an approximately 1.5 cm oval, circumscribed, mobile, palpable mass in the 12 o'clock position of the right breast, 2 cm from the nipple.  Targeted ultrasound is performed, showing a 2.2 cm trilobed cyst containing thin internal septations in the 12 o'clock position of the right breast, 2 cm from the nipple.  IMPRESSION: Right breast cyst.  No evidence of malignancy.  RECOMMENDATION: Bilateral screening mammogram in 1 year.  I have discussed the findings and recommendations with the patient. Results were also provided in writing at the conclusion of the visit. If applicable, a reminder letter will be sent to the patient regarding the next appointment.  BI-RADS CATEGORY  2: Benign.   Electronically Signed   By: Zada Finders.D.  On: 04/15/2014 15:11     Assessment & Plan:  Plan I have discontinued Ms. Schrom's losartan. I am also having her start on predniSONE and EPINEPHrine. Additionally, I am having her maintain her ASPIRIN EC PO, metoprolol succinate, amLODipine, and Vitamin D (Ergocalciferol).  Meds ordered this encounter  Medications  . predniSONE (DELTASONE) 10 MG tablet    Sig: 3 po qd for 3 days then 2 po qd for 3 days the 1 po qd for 3 days    Dispense:  18 tablet    Refill:  0  . EPINEPHrine 0.3 mg/0.3 mL IJ SOAJ injection    Sig: Inject 0.3 mLs (0.3 mg total) into the muscle once.    Dispense:  1 Device    Refill:  2    Problem List Items Addressed This Visit      Unprioritized   Angioedema - Primary    Seems to be worsening each time Depo medrol given in office con't allegra Allergy referral       Relevant Medications   predniSONE (DELTASONE) 10 MG tablet   EPINEPHrine  0.3 mg/0.3 mL IJ SOAJ injection   Other Relevant Orders   Ambulatory referral to Allergy      Follow-up: Return if symptoms worsen or fail to improve.  Loreen Freud, DO

## 2014-12-01 ENCOUNTER — Encounter: Payer: Self-pay | Admitting: Internal Medicine

## 2014-12-01 ENCOUNTER — Ambulatory Visit (INDEPENDENT_AMBULATORY_CARE_PROVIDER_SITE_OTHER): Payer: 59 | Admitting: Internal Medicine

## 2014-12-01 VITALS — BP 138/90 | HR 84 | Temp 98.5°F | Resp 20 | Ht 61.5 in | Wt 113.2 lb

## 2014-12-01 DIAGNOSIS — L501 Idiopathic urticaria: Secondary | ICD-10-CM | POA: Diagnosis not present

## 2014-12-01 NOTE — Progress Notes (Signed)
12/01/2014  Lisa Patterson April 10, 1970 [id number]  Referring provider: Midge Minium, MD [address] [place], [place] [address]  Chief Complaint: Cough; Rash; and Facial Swelling  Lisa Patterson is a 44 y.o. female who is being seen today in consultation for angioedema/urticaria.  HPI:  HPI Comments: Angioedema/Urticaria: Symptoms began about 7 months ago, daily in frequency.  Swelling is mild-moderate, involving lower lip but without associated respiratory/GI/CV compromise.  She has associated urticaria.  She brings in a picture of her rash and is consistent with urticaria.  She took benadryl with resolution of her symptoms.  She cannot identify any specific triggers such as food, medications, products, alcohol.  It was thought that her Losartan may have been the cause so it was discontinued on 8/30 but she has had no change in her symptoms.  Labwork obtained by PCP showed normal CBC, CMP, TSH.    ROS: Per HPI unless specifically indicated below Review of Systems  Constitutional: Negative for fever, chills and unexpected weight change.  HENT: Positive for facial swelling. Negative for congestion, ear pain, postnasal drip, rhinorrhea, sinus pressure, sneezing and sore throat.   Eyes: Negative for discharge and itching.  Respiratory: Positive for cough. Negative for chest tightness, shortness of breath and wheezing.   Cardiovascular: Negative for chest pain and leg swelling.  Gastrointestinal: Negative for vomiting, abdominal pain and diarrhea.  Genitourinary: Negative for difficulty urinating.  Musculoskeletal: Negative for joint swelling.  Skin: Positive for rash.  Allergic/Immunologic: Negative for environmental allergies, food allergies and immunocompromised state.  Neurological: Negative for seizures.     Drug Allergies:  No Known Allergies   Physical Exam: BP 138/90 mmHg  Pulse 84  Temp(Src) 98.5 F (36.9 C) (Oral)  Resp 20  Ht 5' 1.5" (1.562 m)  Wt  113 lb 3.2 oz (51.347 kg)  BMI 21.05 kg/m2  LMP 11/04/2014  Physical Exam  Constitutional: She appears well-developed and well-nourished. No distress.  HENT:  Right Ear: External ear normal.  Left Ear: External ear normal.  Nose: Nose normal.  Mouth/Throat: Oropharynx is clear and moist.  Eyes: Conjunctivae are normal. Right eye exhibits no discharge. Left eye exhibits no discharge.  Cardiovascular: Normal rate, regular rhythm and normal heart sounds.   No murmur heard. Pulmonary/Chest: Effort normal and breath sounds normal. No respiratory distress. She has no wheezes. She has no rales.  Abdominal: Soft. Bowel sounds are normal.  Lymphadenopathy:    She has no cervical adenopathy.  Skin: No rash noted.  Vitals reviewed.     Diagnostics:   Spirometry: FEV1 93 %, FEV1/FVC  78% Spirometry is in the normal range  Food allergy skin testing was performed and was negative with good histamine control    Assessment and Plan:  Idiopathic urticaria Chronic Idiopathic Urticaria  Start fexofenadine 180 mg daily. Increase to twice a day for breakthrough symptoms  Consider for Xolair  Obtain labwork - check ANA, ESR, CU index panel, H Pylori, Tryptase, Alpha Gal     Return in about 6 weeks (around 01/12/2015).  Thank you for the opportunity to care for this patient.  Please do not hesitate to contact me with questions.  [facility] [address] [place], [place] [address] [phone]

## 2014-12-01 NOTE — Patient Instructions (Addendum)
Take Home Sheet  1. Avoidance: NA   2. Antihistamine: Fexofenadine by mouth 180 daily for runny nose or itching.  May increase to twice a day for breaththrough symptoms.   3. Nasal Spray: NA   4. Inhalers: NA   5. Eye Drops: NA   6. Other: Labwork   7. Nasal Saline wash: NA   8. Follow up Visit: 6 weeks   Websites that have reliable Patient information: 1. American Academy of Asthma, Allergy, & Immunology: www.aaaai.org 2. Food Allergy Network: www.foodallergy.org 3. Mothers of Asthmatics: www.aanma.org 4. [facility]: https://www.strong.com/ 5. American College of Allergy, Asthma, & Immunology: BiggerRewards.is or www.acaai.org

## 2014-12-01 NOTE — Assessment & Plan Note (Signed)
Chronic Idiopathic Urticaria  Start fexofenadine 180 mg daily. Increase to twice a day for breakthrough symptoms  Consider for Xolair  Obtain labwork - check ANA, ESR, CU index panel, H Pylori, Tryptase, Alpha Gal

## 2014-12-02 ENCOUNTER — Ambulatory Visit (INDEPENDENT_AMBULATORY_CARE_PROVIDER_SITE_OTHER): Payer: 59 | Admitting: Family Medicine

## 2014-12-02 ENCOUNTER — Encounter: Payer: Self-pay | Admitting: Family Medicine

## 2014-12-02 VITALS — BP 126/82 | HR 87 | Temp 98.1°F | Resp 16 | Ht 61.0 in | Wt 112.1 lb

## 2014-12-02 DIAGNOSIS — I10 Essential (primary) hypertension: Secondary | ICD-10-CM

## 2014-12-02 NOTE — Assessment & Plan Note (Signed)
Chronic problem.  Pt's BP is well controlled today since switching off Losartan.  As an added bonus, pt has not had episode of facial swelling since stopping ARB.  Will continue to monitor.  No changes at this time.

## 2014-12-02 NOTE — Progress Notes (Signed)
   Subjective:    Patient ID: Valentina Lucks, female    DOB: 10/18/1970, 44 y.o.   MRN: [id number]  HPI HTN- chronic problem, meds were switched at last visit due to angioedema.  Pt is now on Amlodipine and Metoprolol w/ good control.  Denies CP, SOB, HAs, visual changes, edema.  Pt has not had additional episodes of angioedema of face/lips.   Review of Systems For ROS see HPI     Objective:   Physical Exam  Constitutional: She is oriented to person, place, and time. She appears well-developed and well-nourished. No distress.  HENT:  Head: Normocephalic and atraumatic.  Eyes: Conjunctivae and EOM are normal. Pupils are equal, round, and reactive to light.  Neck: Normal range of motion. Neck supple. No thyromegaly present.  Cardiovascular: Normal rate, regular rhythm, normal heart sounds and intact distal pulses.   No murmur heard. Pulmonary/Chest: Effort normal and breath sounds normal. No respiratory distress.  Abdominal: Soft. She exhibits no distension. There is no tenderness.  Musculoskeletal: She exhibits no edema.  Lymphadenopathy:    She has no cervical adenopathy.  Neurological: She is alert and oriented to person, place, and time.  Skin: Skin is warm and dry.  Psychiatric: She has a normal mood and affect. Her behavior is normal.  Vitals reviewed.         Assessment & Plan:

## 2014-12-02 NOTE — Progress Notes (Signed)
Pre visit review using our clinic review tool, if applicable. No additional management support is needed unless otherwise documented below in the visit note. 

## 2014-12-07 LAB — ALPHA-GAL PANEL
Alpha Gal IgE*: <0.1 kU/L (ref <0.35)
Class Interpretation: 0
Class Interpretation: 0
LAMB CLASS INTERPRETATION: 0
Lamb/Mutton (Ovis spp) IgE: <0.1 kU/L (ref <0.35)
Pork (Sus spp) IgE: <0.1 kU/L (ref <0.35)

## 2014-12-07 LAB — ANA: Anti Nuclear Antibody(ANA): NEGATIVE

## 2014-12-07 LAB — H. PYLORI BREATH TEST: H. PYLORI UBIT: POSITIVE — AB

## 2014-12-07 LAB — CHRONIC URTICARIA: CU INDEX: 2.6 (ref <10)

## 2014-12-07 LAB — TRYPTASE: TRYPTASE: 3.6 ug/L (ref 2.2–13.2)

## 2014-12-07 LAB — SEDIMENTATION RATE: Sed Rate: 2 mm/hr (ref 0–32)

## 2014-12-08 ENCOUNTER — Other Ambulatory Visit: Payer: Self-pay

## 2014-12-08 ENCOUNTER — Telehealth: Payer: Self-pay

## 2014-12-08 MED ORDER — AMOXICILL-CLARITHRO-LANSOPRAZ PO MISC: Freq: Two times a day (BID) | ORAL | Status: DC

## 2014-12-08 NOTE — Telephone Encounter (Signed)
CALLED PT TO GIVE LAB RESULTS PER DR BHATTI. LEFT MESSAGE FOR PT TO CALL BACK.

## 2014-12-08 NOTE — Telephone Encounter (Signed)
Patient returned call. Gave lab info per Dr. Clydie Braun and let pt know PrevPak was called into pt pharmacy.

## 2014-12-08 NOTE — Addendum Note (Signed)
Addended byMikki Santee on: 12/08/2014 08:41 AM   Modules accepted: Orders

## 2014-12-13 ENCOUNTER — Telehealth: Payer: Self-pay | Admitting: Internal Medicine

## 2014-12-13 ENCOUNTER — Telehealth: Payer: Self-pay | Admitting: *Deleted

## 2014-12-13 MED ORDER — METRONIDAZOLE 500 MG PO TABS: 500.0000 mg | ORAL_TABLET | Freq: Three times a day (TID) | ORAL | Status: DC

## 2014-12-13 MED ORDER — BISMUTH SUBSALICYLATE 262 MG PO TABS: ORAL_TABLET | ORAL | Status: DC

## 2014-12-13 MED ORDER — DOXYCYCLINE HYCLATE 100 MG PO TABS: 100.0000 mg | ORAL_TABLET | Freq: Two times a day (BID) | ORAL | Status: DC

## 2014-12-13 NOTE — Telephone Encounter (Signed)
Dr. Bhatti please advise

## 2014-12-13 NOTE — Telephone Encounter (Signed)
Pt needs to stop current regimen and switch to: Bismuth subsalicylate  (2 of the  tabs) 3x/day x14 days Metronidazole  3x/day x14 days Doxycycline  twice daily x14 days   Continue the Lansoprazole that was in the Prevpack as the acid reducer

## 2014-12-13 NOTE — Telephone Encounter (Signed)
Dr. Clydie Braun had started her on antibiotics  last week. She has been taking them since Friday. She says she can't really tell if they are helping at all. She started having nausea yesterday. Can this be from the medication. If so what can she do for it?

## 2014-12-13 NOTE — Telephone Encounter (Signed)
She has taken Amoxicillin in the past w/o occurrence, but has not taken the 'mycins' previously/SLS Please Advise.

## 2014-12-13 NOTE — Telephone Encounter (Signed)
Patient reports nausea & vomiting and today new breakout of Hives, believed to be coming from Clarithromycin [Amoxicillin is 2nd ABX, along with Lansoprazole & Allegra] for Positive H. Pylori infection; called Allergist office and was that "provider would not be back in office until Wednesday[?]/SLS Please Advise.

## 2014-12-13 NOTE — Telephone Encounter (Signed)
Patient called back again today.  She is feeling very dizzy and weak.  She is concerned it is due to the [facility] that patient started per Dr Clydie Braun on Friday 12/10/2014.  She does not want to wait until Wednesday for Dr. Clydie Braun to address.  Can you help?

## 2014-12-13 NOTE — Telephone Encounter (Signed)
Patient informed, understood & agreed; new Rx to pharmacy/SLS  

## 2014-12-13 NOTE — Telephone Encounter (Signed)
Has pt taken Amox previously?  A Zpack previously (which is a cousin to the Biaxin)?  I want to make sure we changing the right abx

## 2014-12-14 NOTE — Telephone Encounter (Signed)
Please call the patient today and see how she is doing.  If she is still having untoward symptoms, have her stop the medications and discuss with Dr. Clydie Braun tomorrow.

## 2014-12-14 NOTE — Telephone Encounter (Signed)
Called patient today to see how she is feeling.  Patient states she is feeling much better.  She spoke with her PCP Dr. Beverely Low yesterday and the PrevPak was discontinued.  She was prescribed Metronidazole 500 mg three times a day, Doxycycline 100 mg twice a day and SM Stomach Relief Caplets 2 tablets three times a day. Patient is still taking Allegra.  Will forward for Dr. Clydie Braun to review.

## 2014-12-15 NOTE — Telephone Encounter (Signed)
Noted  

## 2015-01-19 ENCOUNTER — Ambulatory Visit (INDEPENDENT_AMBULATORY_CARE_PROVIDER_SITE_OTHER): Payer: 59 | Admitting: Internal Medicine

## 2015-01-19 VITALS — BP 118/72 | HR 100 | Temp 99.2°F | Resp 16

## 2015-01-19 DIAGNOSIS — L501 Idiopathic urticaria: Secondary | ICD-10-CM | POA: Diagnosis not present

## 2015-01-21 ENCOUNTER — Encounter: Payer: Self-pay | Admitting: Internal Medicine

## 2015-01-21 NOTE — Progress Notes (Signed)
01/19/2015  Lisa Patterson August 10, 1970 [id number]  Referring provider: Midge Minium, MD [address] [place], [place] [address]  Chief Complaint: No chief complaint on file.   Lisa Patterson is a 44 y.o. female who is being seen today for follow-up.   HPI Comments: Angioedema/Urticaria: Labwork at last visit positive for H. Pylori.  It showed a normal ANA, tryptase, CU index profile, alpha gal.  Pt was treated with Prevpak but she did not tolerate it due to nausea and vomiting.  She was switched to metronidazole and doxycycline by her PCP and was able to complete the course.  Since then, she has not had any swelling but continues to have urticaria 2-3 times per week on average without any obvious triggers.  She is using Benadryl on an as needed basis only.    ROS: Per HPI unless specifically indicated below Review of Systems   Drug Allergies:  No Known Allergies  Medications:  Current outpatient prescriptions:  .  amLODipine (NORVASC) 5 MG tablet, Take 1 tablet (5 mg total) by mouth daily., Disp: 90 tablet, Rfl: 3 .  amoxicillin-clarithromycin-lansoprazole (PREVPAC) combo pack, Take by mouth 2 (two) times daily. Follow package directions., Disp: 1 kit, Rfl: 0 .  ASPIRIN EC PO, Take by mouth., Disp: , Rfl:  .  Bismuth Subsalicylate 322 MG TABS, TAKE 2 TABLETS THREE TIMES DAILY FOR 14 DAYS, Disp: 84 each, Rfl: 0 .  doxycycline (VIBRA-TABS) 100 MG tablet, Take 1 tablet (100 mg total) by mouth 2 (two) times daily. FOR 14 DAYS, Disp: 28 tablet, Rfl: 0 .  EPINEPHrine 0.3 mg/0.3 mL IJ SOAJ injection, Inject 0.3 mLs (0.3 mg total) into the muscle once., Disp: 1 Device, Rfl: 2 .  metoprolol succinate (TOPROL-XL) 25 MG 24 hr tablet, Take 1 tablet (25 mg total) by mouth daily., Disp: 90 tablet, Rfl: 1 .  metroNIDAZOLE (FLAGYL) 500 MG tablet, Take 1 tablet (500 mg total) by mouth 3 (three) times daily. FOR 14 DAYS, Disp: 42 tablet, Rfl: 0 .  Vitamin D, Ergocalciferol,  (DRISDOL) 50000 UNITS CAPS capsule, Take 1 capsule (50,000 Units total) by mouth every 7 (seven) days., Disp: 12 capsule, Rfl: 0  Physical Exam: There were no vitals taken for this visit.  Physical Exam  Constitutional: She appears well-developed and well-nourished. No distress.  HENT:  Right Ear: External ear normal.  Left Ear: External ear normal.  Nose: Nose normal.  Mouth/Throat: Oropharynx is clear and moist.  Eyes: Conjunctivae are normal. Right eye exhibits no discharge. Left eye exhibits no discharge.  Cardiovascular: Normal rate, regular rhythm and normal heart sounds.   No murmur heard. Pulmonary/Chest: Effort normal and breath sounds normal. No respiratory distress. She has no wheezes. She has no rales.  Abdominal: Soft. Bowel sounds are normal.  Musculoskeletal: She exhibits no edema.  Lymphadenopathy:    She has no cervical adenopathy.  Neurological: She is alert.  Skin: No rash noted.  Vitals reviewed.   Assessment and Plan:  Idiopathic urticaria Chronic Idiopathic Urticaria  Start fexofenadine 180 mg daily. Increase to twice a day for breakthrough symptoms. May continue to use benadryl as needed.  Consider for Xolair     No Follow-up on file.  Thank you for the opportunity to care for this patient.  Please do not hesitate to contact me with questions.  [facility] [address] [place],  [address] [phone]

## 2015-01-21 NOTE — Assessment & Plan Note (Signed)
Chronic Idiopathic Urticaria  Start fexofenadine 180 mg daily. Increase to twice a day for breakthrough symptoms. May continue to use benadryl as needed.  Consider for Xolair

## 2015-01-21 NOTE — Patient Instructions (Signed)
Idiopathic urticaria Chronic Idiopathic Urticaria  Start fexofenadine 180 mg daily. Increase to twice a day for breakthrough symptoms. May continue to use benadryl as needed.  Consider for Xolair

## 2015-03-15 ENCOUNTER — Telehealth: Payer: Self-pay | Admitting: *Deleted

## 2015-03-15 NOTE — Telephone Encounter (Signed)
Form filled out as much as possible and forwarded to Dr. Tabori. JG//CMA  

## 2015-03-21 NOTE — Telephone Encounter (Signed)
Completed forms faxed, sent to scan/[facility] 01/16

## 2015-07-07 ENCOUNTER — Other Ambulatory Visit: Payer: Self-pay | Admitting: Family Medicine

## 2015-07-07 ENCOUNTER — Encounter: Payer: Self-pay | Admitting: Family Medicine

## 2015-07-07 MED FILL — AMLODIPINE BESYLATE 5 MG TA: 5 | 90 days supply | Qty: 90 | Fill #2

## 2015-07-08 MED ORDER — METOPROLOL SUCCINATE ER 25 MG PO TB24: 25.0000 mg | ORAL_TABLET | Freq: Every day | ORAL | Status: DC

## 2015-07-08 MED FILL — METOPROLOL SUCC ER 25 MG TA: 25 | 90 days supply | Qty: 90 | Fill #0

## 2015-07-08 NOTE — Telephone Encounter (Signed)
Medication filled to pharmacy as requested.   

## 2015-09-16 ENCOUNTER — Other Ambulatory Visit: Payer: Self-pay | Admitting: Family Medicine

## 2015-09-16 DIAGNOSIS — Z1231 Encounter for screening mammogram for malignant neoplasm of breast: Secondary | ICD-10-CM

## 2015-09-20 ENCOUNTER — Ambulatory Visit (HOSPITAL_BASED_OUTPATIENT_CLINIC_OR_DEPARTMENT_OTHER)
Admission: RE | Admit: 2015-09-20 | Discharge: 2015-09-20 | Disposition: A | Discharge status: Home / Self Care | Payer: 59 | Source: Ambulatory Visit | Attending: Family Medicine | Admitting: Family Medicine

## 2015-09-20 DIAGNOSIS — Z1231 Encounter for screening mammogram for malignant neoplasm of breast: Secondary | ICD-10-CM | POA: Diagnosis not present

## 2015-09-22 ENCOUNTER — Other Ambulatory Visit: Payer: Self-pay | Admitting: Family Medicine

## 2015-09-22 DIAGNOSIS — R928 Other abnormal and inconclusive findings on diagnostic imaging of breast: Secondary | ICD-10-CM

## 2015-09-28 ENCOUNTER — Ambulatory Visit
Admission: RE | Admit: 2015-09-28 | Discharge: 2015-09-28 | Disposition: A | Discharge status: Home / Self Care | Payer: 59 | Source: Ambulatory Visit | Attending: Family Medicine | Admitting: Family Medicine

## 2015-09-28 DIAGNOSIS — N6001 Solitary cyst of right breast: Secondary | ICD-10-CM | POA: Diagnosis not present

## 2015-09-28 DIAGNOSIS — N63 Unspecified lump in breast: Secondary | ICD-10-CM | POA: Diagnosis not present

## 2015-09-28 DIAGNOSIS — R928 Other abnormal and inconclusive findings on diagnostic imaging of breast: Secondary | ICD-10-CM

## 2015-11-03 ENCOUNTER — Ambulatory Visit (INDEPENDENT_AMBULATORY_CARE_PROVIDER_SITE_OTHER): Payer: 59 | Admitting: Family Medicine

## 2015-11-03 ENCOUNTER — Encounter: Payer: Self-pay | Admitting: Family Medicine

## 2015-11-03 VITALS — BP 119/80 | HR 79 | Temp 99.0°F | Resp 16 | Ht 61.0 in | Wt 111.5 lb

## 2015-11-03 DIAGNOSIS — Z0000 Encounter for general adult medical examination without abnormal findings: Secondary | ICD-10-CM

## 2015-11-03 DIAGNOSIS — Z124 Encounter for screening for malignant neoplasm of cervix: Secondary | ICD-10-CM

## 2015-11-03 DIAGNOSIS — Z23 Encounter for immunization: Secondary | ICD-10-CM

## 2015-11-03 LAB — CBC WITH DIFFERENTIAL/PLATELET
BASOS PCT: 0 %
Basophils Absolute: 0 cells/uL (ref 0–200)
EOS ABS: 0 {cells}/uL — AB (ref 15–500)
Eosinophils Relative: 0 %
HCT: 39.2 % (ref 35.0–45.0)
HEMOGLOBIN: 13 g/dL (ref 11.7–15.5)
LYMPHS ABS: 1593 {cells}/uL (ref 850–3900)
Lymphocytes Relative: 27 %
MCH: 29.2 pg (ref 27.0–33.0)
MCHC: 33.2 g/dL (ref 32.0–36.0)
MCV: 88.1 fL (ref 80.0–100.0)
MONOS PCT: 8 %
MPV: 10.6 fL (ref 7.5–12.5)
Monocytes Absolute: 472 cells/uL (ref 200–950)
NEUTROS ABS: 3835 {cells}/uL (ref 1500–7800)
Neutrophils Relative %: 65 %
PLATELETS: 276 10*3/uL (ref 140–400)
RBC: 4.45 MIL/uL (ref 3.80–5.10)
RDW: 13.7 % (ref 11.0–15.0)
WBC: 5.9 10*3/uL (ref 3.8–10.8)

## 2015-11-03 LAB — TSH: TSH: 1.3 m[IU]/L

## 2015-11-03 MED ORDER — AMLODIPINE BESYLATE 5 MG PO TABS: 5.0000 mg | ORAL_TABLET | Freq: Every day | ORAL | 0 refills | Status: DC

## 2015-11-03 MED FILL — AMLODIPINE BESYLATE 5 MG TA: 5 | 90 days supply | Qty: 90 | Fill #0

## 2015-11-03 NOTE — Patient Instructions (Addendum)
Follow up in 6 months to recheck blood pressure We'll notify you of your lab results and make any changes if needed We'll call you with your GYN appt Keep up the good work!  You look great!!! Call with any questions or concerns Happy Labor Day!!!

## 2015-11-03 NOTE — Progress Notes (Signed)
   Subjective:    Patient ID: Lisa Patterson, female    DOB: 06-03-1970, 45 y.o.   MRN: [id number]  HPI CPE- pt is due for pap, requesting GYN referral.  UTD on mammo.   Review of Systems Patient reports no vision/ hearing changes, adenopathy,fever, weight change,  persistant/recurrent hoarseness , swallowing issues, chest pain, palpitations, edema, persistant/recurrent cough, hemoptysis, dyspnea (rest/exertional/paroxysmal nocturnal), gastrointestinal bleeding (melena, rectal bleeding), abdominal pain, significant heartburn, bowel changes, GU symptoms (dysuria, hematuria, incontinence), Gyn symptoms (abnormal  bleeding, pain),  syncope, focal weakness, memory loss, numbness & tingling, skin/hair/nail changes, abnormal bruising or bleeding, anxiety, or depression.     Objective:   Physical Exam General Appearance:    Alert, cooperative, no distress, appears stated age  Head:    Normocephalic, without obvious abnormality, atraumatic  Eyes:    PERRL, conjunctiva/corneas clear, EOM's intact, fundi    benign, both eyes  Ears:    Normal TM's and external ear canals, both ears  Nose:   Nares normal, septum midline, mucosa normal, no drainage    or sinus tenderness  Throat:   Lips, mucosa, and tongue normal; teeth and gums normal  Neck:   Supple, symmetrical, trachea midline, no adenopathy;    Thyroid: no enlargement/tenderness/nodules  Back:     Symmetric, no curvature, ROM normal, no CVA tenderness  Lungs:     Clear to auscultation bilaterally, respirations unlabored  Chest Wall:    No tenderness or deformity   Heart:    Regular rate and rhythm, S1 and S2 normal, no murmur, rub   or gallop  Breast Exam:    Deferred to GYN  Abdomen:     Soft, non-tender, bowel sounds active all four quadrants,    no masses, no organomegaly  Genitalia:    Deferred to GYN  Rectal:    Extremities:   Extremities normal, atraumatic, no cyanosis or edema  Pulses:   2+ and symmetric all extremities  Skin:   Skin  color, texture, turgor normal, no rashes or lesions  Lymph nodes:   Cervical, supraclavicular, and axillary nodes normal  Neurologic:   CNII-XII intact, normal strength, sensation and reflexes    throughout          Assessment & Plan:

## 2015-11-03 NOTE — Assessment & Plan Note (Signed)
Pt's PE WNL.  Due for GYN- referral placed.  Flu shot given today.  Check labs.  Anticipatory guidance provided.

## 2015-11-03 NOTE — Progress Notes (Signed)
Pre visit review using our clinic review tool, if applicable. No additional management support is needed unless otherwise documented below in the visit note. 

## 2015-11-04 ENCOUNTER — Other Ambulatory Visit: Payer: Self-pay | Admitting: General Practice

## 2015-11-04 LAB — BASIC METABOLIC PANEL
BUN: 11 mg/dL (ref 7–25)
CALCIUM: 9.3 mg/dL (ref 8.6–10.2)
CO2: 21 mmol/L (ref 20–31)
Chloride: 105 mmol/L (ref 98–110)
Creat: 0.87 mg/dL (ref 0.50–1.10)
GLUCOSE: 91 mg/dL (ref 65–99)
Potassium: 3.8 mmol/L (ref 3.5–5.3)
SODIUM: 139 mmol/L (ref 135–146)

## 2015-11-04 LAB — HEPATIC FUNCTION PANEL
ALT: 7 U/L (ref 6–29)
AST: 11 U/L (ref 10–30)
Albumin: 4.4 g/dL (ref 3.6–5.1)
Alkaline Phosphatase: 64 U/L (ref 33–115)
BILIRUBIN DIRECT: 0.2 mg/dL (ref <=0.2)
BILIRUBIN INDIRECT: 1.1 mg/dL (ref 0.2–1.2)
BILIRUBIN TOTAL: 1.3 mg/dL — AB (ref 0.2–1.2)
TOTAL PROTEIN: 7.1 g/dL (ref 6.1–8.1)

## 2015-11-04 LAB — LIPID PANEL
CHOL/HDL RATIO: 2.7 ratio (ref <=5.0)
Cholesterol: 225 mg/dL — ABNORMAL HIGH (ref 125–200)
HDL: 84 mg/dL (ref >=46)
LDL CALC: 124 mg/dL (ref <130)
Triglycerides: 87 mg/dL (ref <150)
VLDL: 17 mg/dL (ref <30)

## 2015-11-04 LAB — VITAMIN D 25 HYDROXY (VIT D DEFICIENCY, FRACTURES): VIT D 25 HYDROXY: 19 ng/mL — AB (ref 30–100)

## 2015-11-04 MED ORDER — VITAMIN D (ERGOCALCIFEROL) 1.25 MG (50000 UNIT) PO CAPS: 50000.0000 [IU] | ORAL_CAPSULE | ORAL | 0 refills | Status: DC

## 2015-11-04 MED FILL — VIT D2 1.25 MG (50,000 UNIT: 1.25 MG | 84 days supply | Qty: 12 | Fill #0

## 2015-12-26 DIAGNOSIS — Z01419 Encounter for gynecological examination (general) (routine) without abnormal findings: Secondary | ICD-10-CM | POA: Diagnosis not present

## 2015-12-26 DIAGNOSIS — Z6821 Body mass index (BMI) 21.0-21.9, adult: Secondary | ICD-10-CM | POA: Diagnosis not present

## 2015-12-31 LAB — HM PAP SMEAR

## 2016-01-10 DIAGNOSIS — R102 Pelvic and perineal pain: Secondary | ICD-10-CM | POA: Diagnosis not present

## 2016-01-10 DIAGNOSIS — R1032 Left lower quadrant pain: Secondary | ICD-10-CM | POA: Diagnosis not present

## 2016-01-30 ENCOUNTER — Ambulatory Visit (INDEPENDENT_AMBULATORY_CARE_PROVIDER_SITE_OTHER): Payer: 59 | Admitting: Family

## 2016-01-30 ENCOUNTER — Encounter: Payer: Self-pay | Admitting: Family

## 2016-01-30 VITALS — BP 127/86 | HR 111 | Temp 98.3°F | Resp 18 | Ht 61.0 in

## 2016-01-30 DIAGNOSIS — J029 Acute pharyngitis, unspecified: Secondary | ICD-10-CM | POA: Diagnosis not present

## 2016-01-30 LAB — POCT RAPID STREP A (OFFICE): RAPID STREP A SCREEN: POSITIVE — AB

## 2016-01-30 MED ORDER — AMOXICILLIN 500 MG PO CAPS: 500.0000 mg | ORAL_CAPSULE | Freq: Three times a day (TID) | ORAL | 0 refills | Status: DC

## 2016-01-30 MED FILL — AMOXICILLIN 500 MG CAPSULE: 500 | 10 days supply | Qty: 30 | Fill #0

## 2016-01-30 MED FILL — METOPROLOL SUCC ER 25 MG TA: 25 | 90 days supply | Qty: 90 | Fill #1

## 2016-01-30 NOTE — Progress Notes (Signed)
Subjective:    Patient ID: Lisa Patterson, female    DOB: 03-02-1971, 45 y.o.   MRN: [id number]  HPI  [name] is a 45 yr old female who presents today with chief complaint of sore throat.  Sore throat began 2 days ago and is associated with mild cough and headache. She denies fever.    Review of Systems See HPI  Past Medical History:  Diagnosis Date  . Hypertension   . Hypertension since 2014     Social History   Social History  . Marital status: Married    Spouse name: N/A  . Number of children: N/A  . Years of education: N/A   Occupational History  . Not on file.   Social History Main Topics  . Smoking status: Never Smoker  . Smokeless tobacco: Never Used  . Alcohol use No  . Drug use: No  . Sexual activity: Not on file   Other Topics Concern  . Not on file   Social History Narrative  . No narrative on file    No past surgical history on file.  Family History  Problem Relation Age of Onset  . Mental illness Mother   . Asthma Mother   . Hyperlipidemia Father   . Hypertension Father   . Diabetes Father   . Diabetes Maternal Grandmother   . Cancer Maternal Grandfather     lung  . Diabetes Paternal Grandmother   . Asthma Son as child  . Cancer Brother     lung  . Cancer Maternal Aunt     breast    No Known Allergies  Current Outpatient Prescriptions on File Prior to Visit  Medication Sig Dispense Refill  . amLODipine (NORVASC) 5 MG tablet Take 1 tablet (5 mg total) by mouth daily. 90 tablet 0  . ASPIRIN EC PO Take by mouth.    . EPINEPHrine 0.3 mg/0.3 mL IJ SOAJ injection Inject 0.3 mLs (0.3 mg total) into the muscle once. 1 Device 2  . fexofenadine (ALLEGRA) 180 MG tablet Take 180 mg by mouth 2 (two) times daily.    . metoprolol succinate (TOPROL-XL) 25 MG 24 hr tablet Take 1 tablet (25 mg total) by mouth daily. 90 tablet 1  . Vitamin D, Ergocalciferol, (DRISDOL) 50000 units CAPS capsule Take 1 capsule (50,000 Units total) by mouth every 7  (seven) days. 12 capsule 0   No current facility-administered medications on file prior to visit.     BP 127/86 (BP Location: Right Arm, Cuff Size: Normal)   Pulse (!) 111   Temp 98.3 F (36.8 C) (Oral)   Resp 18   Ht 5\' 1"  (1.549 m)   LMP 01/28/2016   SpO2 100% Comment: room air      Objective:   Physical Exam  Constitutional: She is oriented to person, place, and time. She appears well-developed and well-nourished.  HENT:  Head: Normocephalic and atraumatic.  Mouth/Throat: Posterior oropharyngeal erythema present. No oropharyngeal exudate.  Eyes: No scleral icterus.  Cardiovascular: Normal rate, regular rhythm and normal heart sounds.   No murmur heard. Pulmonary/Chest: Effort normal and breath sounds normal. No respiratory distress. She has no wheezes.  Musculoskeletal: She exhibits no edema.  Lymphadenopathy:    She has cervical adenopathy.  Neurological: She is alert and oriented to person, place, and time.  Skin: Skin is warm and dry.  Psychiatric: She has a normal mood and affect. Her behavior is normal. Judgment and thought content normal.  Assessment & Plan:  Strep pharyngitis- Rapid strep positive.  rx with amoxicillin, advised motrin prn pain.  Pt is advised to call if new/worsening symptoms ore if not improved in 2-3 days.

## 2016-01-30 NOTE — Patient Instructions (Signed)
Please begin amoxicillin for strep throat. Call if you develop new/worsening symptoms or if you are not feeling better in 2-3 days. You may continue ibuprofen as needed for pain.

## 2016-01-30 NOTE — Progress Notes (Signed)
Pre visit review using our clinic review tool, if applicable. No additional management support is needed unless otherwise documented below in the visit note. 

## 2016-02-13 ENCOUNTER — Encounter: Payer: Self-pay | Admitting: Family

## 2016-02-13 ENCOUNTER — Ambulatory Visit (INDEPENDENT_AMBULATORY_CARE_PROVIDER_SITE_OTHER): Payer: 59 | Admitting: Family

## 2016-02-13 ENCOUNTER — Ambulatory Visit: Payer: 59 | Admitting: Family

## 2016-02-13 VITALS — BP 137/80 | HR 116 | Temp 98.2°F | Resp 18 | Ht 61.0 in

## 2016-02-13 DIAGNOSIS — J029 Acute pharyngitis, unspecified: Secondary | ICD-10-CM

## 2016-02-13 DIAGNOSIS — J020 Streptococcal pharyngitis: Secondary | ICD-10-CM

## 2016-02-13 DIAGNOSIS — R109 Unspecified abdominal pain: Secondary | ICD-10-CM | POA: Diagnosis not present

## 2016-02-13 LAB — POCT RAPID STREP A (OFFICE): RAPID STREP A SCREEN: POSITIVE — AB

## 2016-02-13 MED ORDER — OMEPRAZOLE 40 MG PO CPDR: 40.0000 mg | DELAYED_RELEASE_CAPSULE | Freq: Every day | ORAL | 1 refills | Status: DC

## 2016-02-13 MED ORDER — CEPHALEXIN 500 MG PO CAPS: 500.0000 mg | ORAL_CAPSULE | Freq: Two times a day (BID) | ORAL | 0 refills | Status: DC

## 2016-02-13 MED FILL — CEPHALEXIN 500 MG CAPSULE: 500 | 10 days supply | Qty: 20 | Fill #0

## 2016-02-13 MED FILL — OMEPRAZOLE DR 40 MG CAPSULE: 40 | 30 days supply | Qty: 30 | Fill #0

## 2016-02-13 NOTE — Progress Notes (Signed)
Subjective:    Patient ID: Lisa LucksJackelin Patterson, female    DOB: 1970-05-25, 45 y.o.   MRN: [id number]  HPI  [name] is a 45 yr old female who presents today with chief complaint of sore throat.  She just completed amoxicillin for strep throat. Reports that she felt better.  Developed sore throat on Saturday.  Had vomiting on Saturday.    She reports that she developed an urticarial rash on her wrists/back on Saturday. This was 4 day after she completed the antibiotics and was similar to her previous episodes of idiopathic urticaria. Resolved with benadryl.    Notes some abdominal discomfort for the last few weeks.  Some epigastric discomfort, but sometimes also has lower abdominal discomfort. Notes that the frequency of her BM's has increased while on abx but denies diarrhea.  No vomiting today.   Review of Systems See HPI  Past Medical History:  Diagnosis Date  . Hypertension   . Hypertension since 2014     Social History   Social History  . Marital status: Married    Spouse name: N/A  . Number of children: N/A  . Years of education: N/A   Occupational History  . Not on file.   Social History Main Topics  . Smoking status: Never Smoker  . Smokeless tobacco: Never Used  . Alcohol use No  . Drug use: No  . Sexual activity: Not on file   Other Topics Concern  . Not on file   Social History Narrative  . No narrative on file    No past surgical history on file.  Family History  Problem Relation Age of Onset  . Mental illness Mother   . Asthma Mother   . Hyperlipidemia Father   . Hypertension Father   . Diabetes Father   . Diabetes Maternal Grandmother   . Cancer Maternal Grandfather     lung  . Diabetes Paternal Grandmother   . Asthma Son as child  . Cancer Brother     lung  . Cancer Maternal Aunt     breast    No Known Allergies  Current Outpatient Prescriptions on File Prior to Visit  Medication Sig Dispense Refill  . amLODipine (NORVASC) 5 MG tablet  Take 1 tablet (5 mg total) by mouth daily. 90 tablet 0  . ASPIRIN EC PO Take by mouth.    . EPINEPHrine 0.3 mg/0.3 mL IJ SOAJ injection Inject 0.3 mLs (0.3 mg total) into the muscle once. 1 Device 2  . fexofenadine (ALLEGRA) 180 MG tablet Take 180 mg by mouth 2 (two) times daily.    . metoprolol succinate (TOPROL-XL) 25 MG 24 hr tablet Take 1 tablet (25 mg total) by mouth daily. 90 tablet 1   No current facility-administered medications on file prior to visit.     BP 137/80 (BP Location: Right Arm, Cuff Size: Normal)   Pulse (!) 116   Temp 98.2 F (36.8 C) (Oral)   Resp 18   Ht 5\' 1"  (1.549 m)   LMP 01/28/2016   SpO2 99% Comment: room air      Objective:   Physical Exam  Constitutional: She is oriented to person, place, and time. She appears well-developed and well-nourished.  HENT:  Head: Normocephalic and atraumatic.  Right Ear: Tympanic membrane and ear canal normal.  Left Ear: Tympanic membrane and ear canal normal.  Mouth/Throat: Posterior oropharyngeal erythema present. No oropharyngeal exudate or posterior oropharyngeal edema.  Cardiovascular: Normal rate, regular rhythm and normal heart  sounds.   No murmur heard. Pulmonary/Chest: Effort normal and breath sounds normal. No respiratory distress. She has no wheezes.  Abdominal: Soft. Bowel sounds are normal.  Neg murphys. Mild epigastric pain, Mild RLQ mild LLQ  Tenderness without rebound or guarding.   Musculoskeletal: She exhibits no edema.  Neurological: She is alert and oriented to person, place, and time.  Skin: Skin is warm.  Psychiatric: She has a normal mood and affect. Her behavior is normal. Judgment and thought content normal.          Assessment & Plan:  Strep pharyngitis- rapid strep positive again today.  Will rx with keflex.  Abdominal pain- has some epigastric tenderness but also has some more generalized abdominal discomfort. She does have a history of H pylori in the past with treatment. Will  repeat H pylori breath test and add empiric prilosec.  If symptoms worsen or fail to improve, consider further evaluation with abdominal imaging.

## 2016-02-13 NOTE — Progress Notes (Signed)
Pre visit review using our clinic review tool, if applicable. No additional management support is needed unless otherwise documented below in the visit note. 

## 2016-02-13 NOTE — Patient Instructions (Addendum)
Please begin Keflex twice daily for strep. Begin omeprazole to see if this helps your stomach discomfort.  Let me know if your sore throat or stomach symptoms worsen or if they do not improve.

## 2016-02-14 ENCOUNTER — Encounter: Payer: Self-pay | Admitting: Family

## 2016-02-14 DIAGNOSIS — R05 Cough: Secondary | ICD-10-CM

## 2016-02-14 DIAGNOSIS — R059 Cough, unspecified: Secondary | ICD-10-CM

## 2016-02-14 LAB — H. PYLORI BREATH TEST: H. pylori Breath Test: NOT DETECTED

## 2016-02-15 ENCOUNTER — Ambulatory Visit (HOSPITAL_BASED_OUTPATIENT_CLINIC_OR_DEPARTMENT_OTHER)
Admission: RE | Admit: 2016-02-15 | Discharge: 2016-02-15 | Disposition: A | Discharge status: Home / Self Care | Payer: 59 | Source: Ambulatory Visit | Attending: Family | Admitting: Family

## 2016-02-15 DIAGNOSIS — R059 Cough, unspecified: Secondary | ICD-10-CM

## 2016-02-15 DIAGNOSIS — R05 Cough: Secondary | ICD-10-CM | POA: Insufficient documentation

## 2016-02-15 MED ORDER — GUAIFENESIN-CODEINE 100-10 MG/5ML PO SYRP: 5.0000 mL | ORAL_SOLUTION | Freq: Three times a day (TID) | ORAL | 0 refills | Status: DC | PRN

## 2016-02-15 MED FILL — GUAIATUSSIN AC LIQUID: 100-10 | 8 days supply | Qty: 120 | Fill #0

## 2016-02-15 NOTE — Telephone Encounter (Signed)
Notified pt of above email and faxed Rx to [facility] pharmacy.

## 2016-02-18 DIAGNOSIS — H52223 Regular astigmatism, bilateral: Secondary | ICD-10-CM | POA: Diagnosis not present

## 2016-02-18 DIAGNOSIS — H5213 Myopia, bilateral: Secondary | ICD-10-CM | POA: Diagnosis not present

## 2016-02-21 ENCOUNTER — Telehealth: Payer: Self-pay | Admitting: Family

## 2016-02-21 MED ORDER — VALACYCLOVIR HCL 1 G PO TABS: ORAL_TABLET | ORAL | 0 refills | Status: DC

## 2016-02-21 MED FILL — valACYclovir HCL 1 GM TABS: 1 | 2 days supply | Qty: 4 | Fill #0

## 2016-02-21 NOTE — Telephone Encounter (Signed)
Patient with oral cold sore, rx sent for valtrex.

## 2016-05-04 ENCOUNTER — Ambulatory Visit: Payer: 59 | Admitting: Family Medicine

## 2016-07-05 ENCOUNTER — Other Ambulatory Visit: Payer: Self-pay | Admitting: Family Medicine

## 2016-07-05 ENCOUNTER — Encounter: Payer: Self-pay | Admitting: Family Medicine

## 2016-07-06 MED ORDER — METOPROLOL SUCCINATE ER 25 MG PO TB24: 25.0000 mg | ORAL_TABLET | Freq: Every day | ORAL | 1 refills | Status: DC

## 2016-07-06 MED ORDER — AMLODIPINE BESYLATE 5 MG PO TABS: 5.0000 mg | ORAL_TABLET | Freq: Every day | ORAL | 0 refills | Status: DC

## 2016-07-06 MED FILL — METOPROLOL SUCC ER 25 MG TA: 25 | 90 days supply | Qty: 90 | Fill #0

## 2016-07-06 MED FILL — AMLODIPINE BESYLATE 5 MG TA: 5 | 90 days supply | Qty: 90 | Fill #0

## 2016-10-17 ENCOUNTER — Other Ambulatory Visit: Payer: Self-pay | Admitting: Family Medicine

## 2016-10-17 DIAGNOSIS — Z1231 Encounter for screening mammogram for malignant neoplasm of breast: Secondary | ICD-10-CM

## 2016-10-25 ENCOUNTER — Ambulatory Visit: Payer: 59

## 2016-10-26 ENCOUNTER — Encounter: Payer: Self-pay | Admitting: General Practice

## 2016-11-09 ENCOUNTER — Encounter: Payer: Self-pay | Admitting: General Practice

## 2016-11-09 ENCOUNTER — Ambulatory Visit (INDEPENDENT_AMBULATORY_CARE_PROVIDER_SITE_OTHER): Payer: 59 | Admitting: Family Medicine

## 2016-11-09 ENCOUNTER — Encounter: Payer: Self-pay | Admitting: Family Medicine

## 2016-11-09 VITALS — BP 126/81 | HR 80 | Temp 98.2°F | Resp 16 | Ht 61.0 in | Wt 114.4 lb

## 2016-11-09 DIAGNOSIS — Z1231 Encounter for screening mammogram for malignant neoplasm of breast: Secondary | ICD-10-CM | POA: Diagnosis not present

## 2016-11-09 DIAGNOSIS — E559 Vitamin D deficiency, unspecified: Secondary | ICD-10-CM | POA: Diagnosis not present

## 2016-11-09 DIAGNOSIS — I10 Essential (primary) hypertension: Secondary | ICD-10-CM

## 2016-11-09 DIAGNOSIS — Z0000 Encounter for general adult medical examination without abnormal findings: Secondary | ICD-10-CM

## 2016-11-09 DIAGNOSIS — Z23 Encounter for immunization: Secondary | ICD-10-CM | POA: Diagnosis not present

## 2016-11-09 NOTE — Assessment & Plan Note (Signed)
Pt's PE WNL.  UTD on pap, due for mammo (order entered).  Check labs.  Anticipatory guidance provided.  

## 2016-11-09 NOTE — Progress Notes (Signed)
Pre visit review using our clinic review tool, if applicable. No additional management support is needed unless otherwise documented below in the visit note. 

## 2016-11-09 NOTE — Progress Notes (Addendum)
   Subjective:    Patient ID: Lisa Patterson, female    DOB: January 18, 1971, 46 y.o.   MRN: [id number]  HPI CPE- UTD on pap, due for mammo (order entered).  Pt wants flu shot today.  Pt's medical hx, surgical hx, family hx, and social hx reviewed as indicated in rooming section.   Review of Systems Patient reports no vision/ hearing changes, adenopathy,fever, weight change,  persistant/recurrent hoarseness , swallowing issues, chest pain, palpitations, edema, persistant/recurrent cough, hemoptysis, dyspnea (rest/exertional/paroxysmal nocturnal), gastrointestinal bleeding (melena, rectal bleeding), abdominal pain, significant heartburn, bowel changes, GU symptoms (dysuria, hematuria, incontinence), Gyn symptoms (abnormal  bleeding, pain),  syncope, focal weakness, memory loss, numbness & tingling, skin/hair/nail changes, abnormal bruising or bleeding, anxiety, or depression.     Objective:   Physical Exam General Appearance:    Alert, cooperative, no distress, appears stated age  Head:    Normocephalic, without obvious abnormality, atraumatic  Eyes:    PERRL, conjunctiva/corneas clear, EOM's intact, fundi    benign, both eyes  Ears:    Normal TM's and external ear canals, both ears  Nose:   Nares normal, septum midline, mucosa normal, no drainage    or sinus tenderness  Throat:   Lips, mucosa, and tongue normal; teeth and gums normal  Neck:   Supple, symmetrical, trachea midline, no adenopathy;    Thyroid: no enlargement/tenderness/nodules  Back:     Symmetric, no curvature, ROM normal, no CVA tenderness  Lungs:     Clear to auscultation bilaterally, respirations unlabored  Chest Wall:    No tenderness or deformity   Heart:    Regular rate and rhythm, S1 and S2 normal, no murmur, rub   or gallop  Breast Exam:    Deferred to GYN  Abdomen:     Soft, non-tender, bowel sounds active all four quadrants,    no masses, no organomegaly  Genitalia:    Deferred to GYN  Rectal:    Extremities:    Extremities normal, atraumatic, no cyanosis or edema  Pulses:   2+ and symmetric all extremities  Skin:   Skin color, texture, turgor normal, no rashes or lesions  Lymph nodes:   Cervical, supraclavicular, and axillary nodes normal  Neurologic:   CNII-XII intact, normal strength, sensation and reflexes    throughout          Assessment & Plan:

## 2016-11-09 NOTE — Patient Instructions (Signed)
Follow up in 1 year or as needed We'll notify you of your lab results and make any changes if needed Continue to work on healthy diet and regular exercise- you look great!!! [facility]- [facility]!!! Call with any questions or concerns Happy Early Birthday!!!

## 2016-11-09 NOTE — Assessment & Plan Note (Signed)
Chronic problem.  Well controlled.  Asymptomatic.  Check labs.  No anticipated med changes.  Will follow. 

## 2016-11-09 NOTE — Assessment & Plan Note (Signed)
Check labs.  Replete prn. 

## 2016-11-10 LAB — CBC WITH DIFFERENTIAL/PLATELET
BASOS ABS: 28 {cells}/uL (ref 0–200)
Basophils Relative: 0.4 %
EOS ABS: 7 {cells}/uL — AB (ref 15–500)
Eosinophils Relative: 0.1 %
HCT: 39.6 % (ref 35.0–45.0)
HEMOGLOBIN: 13 g/dL (ref 11.7–15.5)
Lymphs Abs: 1610 cells/uL (ref 850–3900)
MCH: 29.4 pg (ref 27.0–33.0)
MCHC: 32.8 g/dL (ref 32.0–36.0)
MCV: 89.6 fL (ref 80.0–100.0)
MONOS PCT: 7 %
MPV: 10.8 fL (ref 7.5–12.5)
NEUTROS ABS: 4865 {cells}/uL (ref 1500–7800)
NEUTROS PCT: 69.5 %
Platelets: 284 10*3/uL (ref 140–400)
RBC: 4.42 10*6/uL (ref 3.80–5.10)
RDW: 12.2 % (ref 11.0–15.0)
Total Lymphocyte: 23 %
WBC mixed population: 490 cells/uL (ref 200–950)
WBC: 7 10*3/uL (ref 3.8–10.8)

## 2016-11-10 LAB — LIPID PANEL
Cholesterol: 242 mg/dL — ABNORMAL HIGH (ref <200)
HDL: 94 mg/dL (ref >50)
LDL Cholesterol (Calc): 132 mg/dL (calc) — ABNORMAL HIGH
NON-HDL CHOLESTEROL (CALC): 148 mg/dL — AB (ref <130)
Total CHOL/HDL Ratio: 2.6 (calc) (ref <5.0)
Triglycerides: 65 mg/dL (ref <150)

## 2016-11-10 LAB — HEPATIC FUNCTION PANEL
AG RATIO: 1.5 (calc) (ref 1.0–2.5)
ALBUMIN MSPROF: 4.5 g/dL (ref 3.6–5.1)
ALKALINE PHOSPHATASE (APISO): 82 U/L (ref 33–115)
ALT: 9 U/L (ref 6–29)
AST: 13 U/L (ref 10–30)
BILIRUBIN DIRECT: 0.3 mg/dL — AB (ref 0.0–0.2)
BILIRUBIN INDIRECT: 1.4 mg/dL — AB (ref 0.2–1.2)
BILIRUBIN TOTAL: 1.7 mg/dL — AB (ref 0.2–1.2)
GLOBULIN: 3 g/dL (ref 1.9–3.7)
TOTAL PROTEIN: 7.5 g/dL (ref 6.1–8.1)

## 2016-11-10 LAB — BASIC METABOLIC PANEL WITH GFR
BUN: 8 mg/dL (ref 7–25)
CHLORIDE: 105 mmol/L (ref 98–110)
CO2: 21 mmol/L (ref 20–32)
CREATININE: 0.7 mg/dL (ref 0.50–1.10)
Calcium: 9.1 mg/dL (ref 8.6–10.2)
GFR, Est African American: 123 mL/min/{1.73_m2} (ref >=60)
GFR, Est Non African American: 106 mL/min/{1.73_m2} (ref >=60)
Glucose, Bld: 79 mg/dL (ref 65–99)
Potassium: 3.7 mmol/L (ref 3.5–5.3)
Sodium: 138 mmol/L (ref 135–146)

## 2016-11-10 LAB — VITAMIN D 25 HYDROXY (VIT D DEFICIENCY, FRACTURES): VIT D 25 HYDROXY: 16 ng/mL — AB (ref 30–100)

## 2016-11-10 LAB — TSH: TSH: 1.77 m[IU]/L

## 2016-11-12 ENCOUNTER — Other Ambulatory Visit: Payer: Self-pay | Admitting: General Practice

## 2016-11-12 MED ORDER — VITAMIN D (ERGOCALCIFEROL) 1.25 MG (50000 UNIT) PO CAPS: 50000.0000 [IU] | ORAL_CAPSULE | ORAL | 0 refills | Status: DC

## 2016-11-12 MED FILL — VIT D2 1.25 MG (50,000 UNIT: 1.25 MG | 84 days supply | Qty: 12 | Fill #0

## 2016-11-13 ENCOUNTER — Other Ambulatory Visit: Payer: Self-pay | Admitting: Family Medicine

## 2016-11-13 MED FILL — METOPROLOL SUCC ER 25 MG TA: 25 | 90 days supply | Qty: 90 | Fill #1

## 2016-11-13 MED FILL — AMLODIPINE BESYLATE 5 MG TA: 5 | 90 days supply | Qty: 90 | Fill #0

## 2016-11-19 ENCOUNTER — Ambulatory Visit
Admission: RE | Admit: 2016-11-19 | Discharge: 2016-11-19 | Disposition: A | Discharge status: Home / Self Care | Payer: 59 | Source: Ambulatory Visit | Attending: Family Medicine | Admitting: Family Medicine

## 2016-11-19 DIAGNOSIS — Z1231 Encounter for screening mammogram for malignant neoplasm of breast: Secondary | ICD-10-CM

## 2017-01-28 ENCOUNTER — Encounter: Payer: Self-pay | Admitting: Family

## 2017-01-28 ENCOUNTER — Ambulatory Visit (INDEPENDENT_AMBULATORY_CARE_PROVIDER_SITE_OTHER): Payer: 59 | Admitting: Family

## 2017-01-28 VITALS — BP 129/93 | HR 97 | Temp 98.3°F | Resp 16 | Ht 61.0 in

## 2017-01-28 DIAGNOSIS — J029 Acute pharyngitis, unspecified: Secondary | ICD-10-CM | POA: Diagnosis not present

## 2017-01-28 DIAGNOSIS — J069 Acute upper respiratory infection, unspecified: Secondary | ICD-10-CM

## 2017-01-28 DIAGNOSIS — R059 Cough, unspecified: Secondary | ICD-10-CM

## 2017-01-28 DIAGNOSIS — R05 Cough: Secondary | ICD-10-CM

## 2017-01-28 DIAGNOSIS — B9789 Other viral agents as the cause of diseases classified elsewhere: Secondary | ICD-10-CM | POA: Diagnosis not present

## 2017-01-28 LAB — POCT RAPID STREP A (OFFICE): Rapid Strep A Screen: NEGATIVE

## 2017-01-28 LAB — POC INFLUENZA A&B (BINAX/QUICKVUE)
Influenza A, POC: NEGATIVE
Influenza B, POC: NEGATIVE

## 2017-01-28 MED ORDER — VALACYCLOVIR HCL 1 G PO TABS: ORAL_TABLET | ORAL | 0 refills | Status: DC

## 2017-01-28 MED ORDER — GUAIFENESIN-CODEINE 100-10 MG/5ML PO SYRP: 5.0000 mL | ORAL_SOLUTION | Freq: Three times a day (TID) | ORAL | 0 refills | Status: DC | PRN

## 2017-01-28 MED FILL — valACYclovir HCL 1 GM TABS: 1 | 7 days supply | Qty: 15 | Fill #0

## 2017-01-28 MED FILL — VIRTUSSIN AC LIQUID: 100-10 | 5 days supply | Qty: 75 | Fill #0

## 2017-01-28 NOTE — Progress Notes (Signed)
Subjective:    Patient ID: Lisa Patterson, female    DOB: [birth year], 46 y.o.   MRN: [id number]  HPI  Ms. Linda HedgesMatos is a 46 yr old female who presents today with chief complaint of cough. Symptoms began on Tuesday with sore throat until Thursday when she developed cough.  Then developed a frontal headache.  Using dayquil/nyquil. Not sleeping well due to the cough. + Body aches.     Review of Systems    see HPI  Past Medical History:  Diagnosis Date  . Hypertension   . Hypertension since 2014     Social History   Socioeconomic History  . Marital status: Married    Spouse name: Not on file  . Number of children: Not on file  . Years of education: Not on file  . Highest education level: Not on file  Social Needs  . Financial resource strain: Not on file  . Food insecurity - worry: Not on file  . Food insecurity - inability: Not on file  . Transportation needs - medical: Not on file  . Transportation needs - non-medical: Not on file  Occupational History  . Not on file  Tobacco Use  . Smoking status: Never Smoker  . Smokeless tobacco: Never Used  Substance and Sexual Activity  . Alcohol use: No  . Drug use: No  . Sexual activity: Not on file  Other Topics Concern  . Not on file  Social History Narrative  . Not on file    No past surgical history on file.  Family History  Problem Relation Age of Onset  . Mental illness Mother   . Asthma Mother   . Hyperlipidemia Father   . Hypertension Father   . Diabetes Father   . Diabetes Maternal Grandmother   . Cancer Maternal Grandfather        lung  . Diabetes Paternal Grandmother   . Asthma Son as child  . Cancer Brother        lung  . Cancer Maternal Aunt        breast  . Breast cancer Maternal Aunt     No Known Allergies  Current Outpatient Medications on File Prior to Visit  Medication Sig Dispense Refill  . amLODipine (NORVASC) 5 MG tablet TAKE 1 TABLET (5 MG TOTAL) BY MOUTH DAILY. 90 tablet 0  . ASPIRIN  EC PO Take by mouth.    . EPINEPHrine 0.3 mg/0.3 mL IJ SOAJ injection Inject 0.3 mLs (0.3 mg total) into the muscle once. 1 Device 2  . fexofenadine (ALLEGRA) 180 MG tablet Take 180 mg by mouth 2 (two) times daily.    . metoprolol succinate (TOPROL-XL) 25 MG 24 hr tablet Take 1 tablet (25 mg total) by mouth daily. 90 tablet 1  . omeprazole (PRILOSEC) 40 MG capsule Take 1 capsule (40 mg total) by mouth daily. 30 capsule 1  . valACYclovir (VALTREX) 1000 MG tablet 2 tabs by mouth now. Repeat in 12 hours 4 tablet 0  . Vitamin D, Ergocalciferol, (DRISDOL) 50000 units CAPS capsule Take 1 capsule (50,000 Units total) by mouth every 7 (seven) days. 12 capsule 0   No current facility-administered medications on file prior to visit.     BP (!) 129/93 (BP Location: Right Arm, Cuff Size: Normal)   Pulse 97   Temp 98.3 F (36.8 C) (Oral)   Resp 16   Ht 5\' 1"  (1.549 m)   SpO2 100%   BMI 21.61 kg/m    Objective:  Physical Exam  Constitutional: She is oriented to person, place, and time. She appears well-developed and well-nourished.  HENT:  Head: Normocephalic and atraumatic.  Right Ear: Tympanic membrane and ear canal normal.  Left Ear: Tympanic membrane and ear canal normal.  Mouth/Throat: No oropharyngeal exudate, posterior oropharyngeal edema or posterior oropharyngeal erythema.  Cardiovascular: Normal rate, regular rhythm and normal heart sounds.  No murmur heard. Pulmonary/Chest: Effort normal and breath sounds normal. No respiratory distress. She has no wheezes.  Lymphadenopathy:    She has no cervical adenopathy.  Neurological: She is alert and oriented to person, place, and time.  Skin: Skin is warm and dry.  Psychiatric: She has a normal mood and affect. Her behavior is normal. Judgment and thought content normal.          Assessment & Plan:  Viral URI with cough- rapid strep and flu swab negative will rx with cheratussin cough syrup, continue supportive measures, follow up  if new/worsening symptoms or if not improved in 2-3 days.

## 2017-01-28 NOTE — Patient Instructions (Signed)
Begin Cheratussin as needed for cough- do not drive after taking. Call if new/worsening symptoms or if not improved in 2-3 days.

## 2017-05-24 ENCOUNTER — Ambulatory Visit: Payer: 59 | Admitting: Medical

## 2017-05-24 ENCOUNTER — Encounter: Payer: Self-pay | Admitting: Medical

## 2017-05-24 VITALS — BP 146/105 | HR 113 | Temp 98.3°F | Resp 16 | Ht 61.0 in | Wt 117.0 lb

## 2017-05-24 DIAGNOSIS — R05 Cough: Secondary | ICD-10-CM

## 2017-05-24 DIAGNOSIS — J029 Acute pharyngitis, unspecified: Secondary | ICD-10-CM

## 2017-05-24 DIAGNOSIS — R059 Cough, unspecified: Secondary | ICD-10-CM

## 2017-05-24 LAB — POCT RAPID STREP A (OFFICE): RAPID STREP A SCREEN: NEGATIVE

## 2017-05-24 MED ORDER — AMOXICILLIN 875 MG PO TABS: 875.0000 mg | ORAL_TABLET | Freq: Two times a day (BID) | ORAL | 0 refills | Status: DC

## 2017-05-24 MED ORDER — BENZONATATE 100 MG PO CAPS: 100.0000 mg | ORAL_CAPSULE | Freq: Three times a day (TID) | ORAL | 0 refills | Status: DC | PRN

## 2017-05-24 MED FILL — AMOXICILLIN 875 MG TABLET: 875 | 10 days supply | Qty: 20 | Fill #0

## 2017-05-24 MED FILL — BENZONATATE 100 MG CAPSULE: 100 | 10 days supply | Qty: 30 | Fill #0

## 2017-05-24 NOTE — Patient Instructions (Signed)
Your throat exam looks very suspicious for probable strep throat although your rapid strep test was negative.  Note rapid test can be falsely negative.  As we approached the weekend, I do think it is best to go ahead and give you amoxicillin antibiotic.  For your mild intermittent cough presently, I am prescribing benzonatate.  You mention some mild irritation/itching earlier this week.  If you get any other signs/symptoms of allergic rhinitis please let us know.  Follow-up in 7-10 days or as needed.

## 2017-05-24 NOTE — Progress Notes (Signed)
Subjective:    Patient ID: Lisa Patterson, female    DOB: Nov 25, 1970, 47 y.o.   MRN: [id number]  HPI  Pt in for st. Pain and itching to throat. No fever, no chills, no body aches or ha.  Moderate st.  She works as Scientist, physiologicalreceptionist.  Pt cough just started last night. Dry cough. No wheezing.  Pt has had some mild sneezing.    Review of Systems  Constitutional: Negative for chills, fatigue and fever.  HENT: Positive for sneezing and sore throat. Negative for congestion, drooling, ear pain, hearing loss, mouth sores, postnasal drip, sinus pressure, sinus pain and trouble swallowing.        Mild sneezing.  Respiratory: Negative for cough, chest tightness, shortness of breath and wheezing.   Cardiovascular: Negative for chest pain and palpitations.  Musculoskeletal: Negative for back pain and gait problem.  Skin: Negative for rash.  Neurological: Negative for dizziness, speech difficulty, weakness and headaches.  Hematological: Negative for adenopathy. Does not bruise/bleed easily.  Psychiatric/Behavioral: Negative for behavioral problems and confusion.    Past Medical History:  Diagnosis Date  . Hypertension   . Hypertension since 2014     Social History   Socioeconomic History  . Marital status: Married    Spouse name: Not on file  . Number of children: Not on file  . Years of education: Not on file  . Highest education level: Not on file  Occupational History  . Not on file  Social Needs  . Financial resource strain: Not on file  . Food insecurity:    Worry: Not on file    Inability: Not on file  . Transportation needs:    Medical: Not on file    Non-medical: Not on file  Tobacco Use  . Smoking status: Never Smoker  . Smokeless tobacco: Never Used  Substance and Sexual Activity  . Alcohol use: No  . Drug use: No  . Sexual activity: Not on file  Lifestyle  . Physical activity:    Days per week: Not on file    Minutes per session: Not on file  . Stress: Not  on file  Relationships  . Social connections:    Talks on phone: Not on file    Gets together: Not on file    Attends religious service: Not on file    Active member of club or organization: Not on file    Attends meetings of clubs or organizations: Not on file    Relationship status: Not on file  . Intimate partner violence:    Fear of current or ex partner: Not on file    Emotionally abused: Not on file    Physically abused: Not on file    Forced sexual activity: Not on file  Other Topics Concern  . Not on file  Social History Narrative  . Not on file    No past surgical history on file.  Family History  Problem Relation Age of Onset  . Mental illness Mother   . Asthma Mother   . Hyperlipidemia Father   . Hypertension Father   . Diabetes Father   . Diabetes Maternal Grandmother   . Cancer Maternal Grandfather        lung  . Diabetes Paternal Grandmother   . Asthma Son as child  . Cancer Brother        lung  . Cancer Maternal Aunt        breast  . Breast cancer Maternal Aunt  No Known Allergies  Current Outpatient Medications on File Prior to Visit  Medication Sig Dispense Refill  . amLODipine (NORVASC) 5 MG tablet TAKE 1 TABLET (5 MG TOTAL) BY MOUTH DAILY. 90 tablet 0  . ASPIRIN EC PO Take by mouth.    . EPINEPHrine 0.3 mg/0.3 mL IJ SOAJ injection Inject 0.3 mLs (0.3 mg total) into the muscle once. 1 Device 2  . fexofenadine (ALLEGRA) 180 MG tablet Take 180 mg by mouth 2 (two) times daily.    Marland Kitchen guaiFENesin-codeine (CHERATUSSIN AC) 100-10 MG/5ML syrup Take 5 mLs by mouth 3 (three) times daily as needed for cough. 75 mL 0  . metoprolol succinate (TOPROL-XL) 25 MG 24 hr tablet Take 1 tablet (25 mg total) by mouth daily. 90 tablet 1  . omeprazole (PRILOSEC) 40 MG capsule Take 1 capsule (40 mg total) by mouth daily. 30 capsule 1  . valACYclovir (VALTREX) 1000 MG tablet 2 tabs by mouth now. Repeat in 12 hours 15 tablet 0  . Vitamin D, Ergocalciferol, (DRISDOL)  50000 units CAPS capsule Take 1 capsule (50,000 Units total) by mouth every 7 (seven) days. 12 capsule 0   No current facility-administered medications on file prior to visit.     BP (!) 146/105   Pulse (!) 113   Temp 98.3 F (36.8 C) (Oral)   Resp 16   Ht 5\' 1"  (1.549 m)   Wt 117 lb (53.1 kg)   SpO2 100%   BMI 22.11 kg/m       Objective:   Physical Exam  General  Mental Status - Alert. General Appearance - Well groomed. Not in acute distress.  Skin Rashes- No Rashes.  HEENT Head- Normal. Ear Auditory Canal - Left- Normal. Right - Normal.Tympanic Membrane- Left- Normal. Right- Normal. Eye Sclera/Conjunctiva- Left- Normal. Right- Normal. Nose & Sinuses Nasal Mucosa- Left-  Boggy and Congested. Right-  Boggy and  Congested.Bilateral maxillary and frontal sinus pressure. Mouth & Throat Lips: Upper Lip- Normal: no dryness, cracking, pallor, cyanosis, or vesicular eruption. Lower Lip-Normal: no dryness, cracking, pallor, cyanosis or vesicular eruption. Buccal Mucosa- Bilateral- No Aphthous ulcers. Oropharynx- No Discharge or Erythema. Tonsils: Characteristics- Bilateral- Bright Erythema. Size/Enlargement- Bilateral- 1-2 +enlargement. Discharge- bilateral-None.  Neck Neck- Supple. No Masses.Faint large submandibular tenderness.   Chest and Lung Exam Auscultation: Breath Sounds:-Clear even and unlabored.  Cardiovascular Auscultation:Rythm- Regular, rate and rhythm. Murmurs & Other Heart Sounds:Ausculatation of the heart reveal- No Murmurs.  Lymphatic Head & Neck General Head & Neck Lymphatics: Bilateral: Description- see neck exam.      Assessment & Plan:  Your throat exam looks very suspicious for probable strep throat although your rapid strep test was negative.  Note rapid test can be falsely negative.  As we approached the weekend, I do think it is best to go ahead and give you amoxicillin antibiotic.  For your mild intermittent cough presently, I am  prescribing benzonatate.  You mention some mild irritation/itching earlier this week.  If you get any other signs/symptoms of allergic rhinitis please let us know.  Follow-up in 7-10 days or as needed.  Patient has high blood pressure and she did not take her medications this morning.  Advised her to  take her medications when she gets home.  Esperanza Richters, PA-C

## 2017-05-27 ENCOUNTER — Encounter: Payer: Self-pay | Admitting: Family Medicine

## 2017-05-27 ENCOUNTER — Ambulatory Visit: Payer: 59 | Admitting: Family Medicine

## 2017-05-27 VITALS — BP 134/78 | HR 84 | Temp 98.0°F | Ht 61.0 in | Wt 117.0 lb

## 2017-05-27 DIAGNOSIS — J111 Influenza due to unidentified influenza virus with other respiratory manifestations: Secondary | ICD-10-CM

## 2017-05-27 DIAGNOSIS — R6889 Other general symptoms and signs: Secondary | ICD-10-CM

## 2017-05-27 NOTE — Progress Notes (Signed)
Pre visit review using our clinic review tool, if applicable. No additional management support is needed unless otherwise documented below in the visit note. 

## 2017-05-27 NOTE — Progress Notes (Signed)
Chief Complaint  Patient presents with  . Generalized Body Aches    Lisa LucksJackelin Patterson here for URI complaints.  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 Hypertension   . Hypertension since 2014     BP 134/78 (BP Location: Left Arm, Patient Position: Sitting, Cuff Size: Normal)   Pulse 84   Temp 98 F (36.7 C) (Oral)   Ht 5\' 1"  (1.549 m)   Wt 117 lb (53.1 kg)   SpO2 99%   BMI 22.11 kg/m  General: Awake, alert, appears stated age HEENT: AT, , ears patent b/l and TM's neg, nares patent w/o discharge, mildly erythematous and without exudates, MMM Neck: No masses or asymmetry Heart: RRR Lungs: CTAB, no accessory muscle use Psych: Age appropriate judgment and insight, normal mood and affect  Flu-like symptoms  >48 hrs since onset of s/s's so will hold off on swabbing and Tamiflu.  0/4 Centor Criteria and minimal improvement with amox, will have her stop. Rapid strep was neg 3 d ago, no cx done.  Letter for work given.  F/u prn. If starting to experience fevers, shaking, or shortness of breath, seek immediate care. Pt voiced understanding and agreement to the plan.  Jilda Rocheicholas [place], DO 05/27/17 12:11 PM

## 2017-05-27 NOTE — Patient Instructions (Addendum)
Continue to push fluids, practice good hand hygiene, and cover your mouth if you cough.  If you start having fevers, shaking or shortness of breath, seek immediate care.  Aleve 220 mg tabs; take 2 tabs twice daily.  OK to take Tylenol 1000 mg (2 extra strength tabs) or 975 mg (3 regular strength tabs) every 6 hours as needed.  OK to stop Amoxicillin. Text me if anything comes up or if you aren't sure about anything.  Let us know if you need anything.

## 2017-06-17 ENCOUNTER — Other Ambulatory Visit: Payer: Self-pay | Admitting: Family Medicine

## 2017-06-18 MED ORDER — AMLODIPINE BESYLATE 5 MG PO TABS: 5.0000 mg | ORAL_TABLET | Freq: Every day | ORAL | 0 refills | Status: DC

## 2017-06-18 MED FILL — AMLODIPINE BESYLATE 5 MG TA: 5 | 90 days supply | Qty: 90 | Fill #0

## 2017-06-18 MED FILL — METOPROLOL SUCCINATE ER 25: 25 | 90 days supply | Qty: 90 | Fill #0

## 2017-10-21 ENCOUNTER — Other Ambulatory Visit: Payer: Self-pay | Admitting: Family Medicine

## 2017-10-21 MED ORDER — SULFAMETHOXAZOLE-TRIMETHOPRIM 800-160 MG PO TABS: 1.0000 | ORAL_TABLET | Freq: Two times a day (BID) | ORAL | 0 refills | Status: AC

## 2017-10-21 MED FILL — SULFAMETHOXAZOLE-TMP DS TAB: 800-160 | 3 days supply | Qty: 6 | Fill #0

## 2017-10-21 NOTE — Progress Notes (Signed)
Urine dip suggestive of infection, Bactrim called in. Pt notified.

## 2017-11-01 ENCOUNTER — Other Ambulatory Visit: Payer: Self-pay | Admitting: Family Medicine

## 2017-11-01 DIAGNOSIS — Z1231 Encounter for screening mammogram for malignant neoplasm of breast: Secondary | ICD-10-CM

## 2017-11-15 ENCOUNTER — Ambulatory Visit: Payer: 59 | Admitting: Family Medicine

## 2017-12-19 ENCOUNTER — Ambulatory Visit (INDEPENDENT_AMBULATORY_CARE_PROVIDER_SITE_OTHER): Payer: 59 | Admitting: Family Medicine

## 2017-12-19 ENCOUNTER — Other Ambulatory Visit: Payer: Self-pay

## 2017-12-19 ENCOUNTER — Ambulatory Visit
Admission: RE | Admit: 2017-12-19 | Discharge: 2017-12-19 | Disposition: A | Discharge status: Home / Self Care | Payer: 59 | Source: Ambulatory Visit | Attending: Family Medicine | Admitting: Family Medicine

## 2017-12-19 ENCOUNTER — Encounter: Payer: Self-pay | Admitting: Family Medicine

## 2017-12-19 VITALS — BP 126/80 | HR 67 | Temp 98.1°F | Resp 16 | Ht 61.0 in | Wt 120.5 lb

## 2017-12-19 DIAGNOSIS — I10 Essential (primary) hypertension: Secondary | ICD-10-CM

## 2017-12-19 DIAGNOSIS — Z0000 Encounter for general adult medical examination without abnormal findings: Secondary | ICD-10-CM

## 2017-12-19 DIAGNOSIS — R8290 Unspecified abnormal findings in urine: Secondary | ICD-10-CM | POA: Diagnosis not present

## 2017-12-19 DIAGNOSIS — E559 Vitamin D deficiency, unspecified: Secondary | ICD-10-CM | POA: Diagnosis not present

## 2017-12-19 DIAGNOSIS — Z1231 Encounter for screening mammogram for malignant neoplasm of breast: Secondary | ICD-10-CM | POA: Diagnosis not present

## 2017-12-19 LAB — POCT URINALYSIS DIPSTICK
BILIRUBIN UA: NEGATIVE
Glucose, UA: NEGATIVE
KETONES UA: NEGATIVE
Leukocytes, UA: NEGATIVE
Nitrite, UA: POSITIVE
Protein, UA: NEGATIVE
SPEC GRAV UA: 1.015 (ref 1.010–1.025)
Urobilinogen, UA: 0.2 E.U./dL
pH, UA: 7 (ref 5.0–8.0)

## 2017-12-19 LAB — HEPATIC FUNCTION PANEL
ALBUMIN: 4.5 g/dL (ref 3.5–5.2)
ALK PHOS: 81 U/L (ref 39–117)
ALT: 7 U/L (ref 0–35)
AST: 12 U/L (ref 0–37)
BILIRUBIN DIRECT: 0.2 mg/dL (ref 0.0–0.3)
BILIRUBIN TOTAL: 1.1 mg/dL (ref 0.2–1.2)
Total Protein: 7.3 g/dL (ref 6.0–8.3)

## 2017-12-19 LAB — TSH: TSH: 1.61 u[IU]/mL (ref 0.35–4.50)

## 2017-12-19 LAB — CBC WITH DIFFERENTIAL/PLATELET
BASOS PCT: 0.4 % (ref 0.0–3.0)
Basophils Absolute: 0 10*3/uL (ref 0.0–0.1)
EOS PCT: 0.5 % (ref 0.0–5.0)
Eosinophils Absolute: 0 10*3/uL (ref 0.0–0.7)
HCT: 39.6 % (ref 36.0–46.0)
Hemoglobin: 12.9 g/dL (ref 12.0–15.0)
LYMPHS ABS: 1.2 10*3/uL (ref 0.7–4.0)
Lymphocytes Relative: 30 % (ref 12.0–46.0)
MCHC: 32.6 g/dL (ref 30.0–36.0)
MCV: 88.7 fl (ref 78.0–100.0)
MONO ABS: 0.4 10*3/uL (ref 0.1–1.0)
MONOS PCT: 9.3 % (ref 3.0–12.0)
NEUTROS ABS: 2.3 10*3/uL (ref 1.4–7.7)
NEUTROS PCT: 59.8 % (ref 43.0–77.0)
PLATELETS: 270 10*3/uL (ref 150.0–400.0)
RBC: 4.47 Mil/uL (ref 3.87–5.11)
RDW: 14.2 % (ref 11.5–15.5)
WBC: 3.8 10*3/uL — ABNORMAL LOW (ref 4.0–10.5)

## 2017-12-19 LAB — LIPID PANEL
Cholesterol: 208 mg/dL — ABNORMAL HIGH (ref 0–200)
HDL: 67 mg/dL (ref >39.00)
LDL Cholesterol: 119 mg/dL — ABNORMAL HIGH (ref 0–99)
NONHDL: 141.19
TRIGLYCERIDES: 111 mg/dL (ref 0.0–149.0)
Total CHOL/HDL Ratio: 3
VLDL: 22.2 mg/dL (ref 0.0–40.0)

## 2017-12-19 LAB — VITAMIN D 25 HYDROXY (VIT D DEFICIENCY, FRACTURES): VITD: 15.92 ng/mL — AB (ref 30.00–100.00)

## 2017-12-19 LAB — BASIC METABOLIC PANEL
BUN: 6 mg/dL (ref 6–23)
CO2: 25 meq/L (ref 19–32)
CREATININE: 0.77 mg/dL (ref 0.40–1.20)
Calcium: 9.2 mg/dL (ref 8.4–10.5)
Chloride: 105 mEq/L (ref 96–112)
GFR: 86.14 mL/min (ref >60.00)
GLUCOSE: 92 mg/dL (ref 70–99)
Potassium: 4 mEq/L (ref 3.5–5.1)
SODIUM: 137 meq/L (ref 135–145)

## 2017-12-19 MED ORDER — CEPHALEXIN 500 MG PO CAPS: 500.0000 mg | ORAL_CAPSULE | Freq: Two times a day (BID) | ORAL | 0 refills | Status: AC

## 2017-12-19 MED FILL — CEPHALEXIN 500 MG CAPSULE: 500 | 5 days supply | Qty: 10 | Fill #0

## 2017-12-19 NOTE — Patient Instructions (Signed)
Follow up in 1 year or as needed We'll notify you of your lab results and make any changes if needed Keep up the good work on healthy diet and regular exercise- you look great! Call with any questions or concerns Happy Fall!!! 

## 2017-12-19 NOTE — Progress Notes (Signed)
   Subjective:    Patient ID: Lisa Patterson, female    DOB: 09-03-1970, 47 y.o.   MRN: [id number]  HPI CPE- UTD on GYN, immunizations.   Review of Systems Patient reports no vision/ hearing changes, adenopathy,fever, weight change,  persistant/recurrent hoarseness , swallowing issues, chest pain, palpitations, edema, persistant/recurrent cough, hemoptysis, dyspnea (rest/exertional/paroxysmal nocturnal), gastrointestinal bleeding (melena, rectal bleeding), abdominal pain, significant heartburn, bowel changes, Gyn symptoms (abnormal  bleeding, pain),  syncope, focal weakness, memory loss, numbness & tingling, skin/hair/nail changes, abnormal bruising or bleeding, anxiety, or depression.   + urine odor    Objective:   Physical Exam General Appearance:    Alert, cooperative, no distress, appears stated age  Head:    Normocephalic, without obvious abnormality, atraumatic  Eyes:    PERRL, conjunctiva/corneas clear, EOM's intact, fundi    benign, both eyes  Ears:    Normal TM's and external ear canals, both ears  Nose:   Nares normal, septum midline, mucosa normal, no drainage    or sinus tenderness  Throat:   Lips, mucosa, and tongue normal; teeth and gums normal  Neck:   Supple, symmetrical, trachea midline, no adenopathy;    Thyroid: no enlargement/tenderness/nodules  Back:     Symmetric, no curvature, ROM normal, no CVA tenderness  Lungs:     Clear to auscultation bilaterally, respirations unlabored  Chest Wall:    No tenderness or deformity   Heart:    Regular rate and rhythm, S1 and S2 normal, no murmur, rub   or gallop  Breast Exam:    Deferred to GYN  Abdomen:     Soft, non-tender, bowel sounds active all four quadrants,    no masses, no organomegaly  Genitalia:    Deferred to GYN  Rectal:    Extremities:   Extremities normal, atraumatic, no cyanosis or edema  Pulses:   2+ and symmetric all extremities  Skin:   Skin color, texture, turgor normal, no rashes or lesions  Lymph  nodes:   Cervical, supraclavicular, and axillary nodes normal  Neurologic:   CNII-XII intact, normal strength, sensation and reflexes    throughout          Assessment & Plan:

## 2017-12-19 NOTE — Assessment & Plan Note (Signed)
Chronic problem.  Well controlled today.  Asymptomatic.  Check labs.  No anticipated med changes. 

## 2017-12-19 NOTE — Assessment & Plan Note (Signed)
Pt has hx of this.  Check labs and replete prn. 

## 2017-12-19 NOTE — Addendum Note (Signed)
Addended by: Laddie Aquas A on: 12/19/2017 11:04 AM   Modules accepted: Orders

## 2017-12-19 NOTE — Assessment & Plan Note (Signed)
Pt's PE WNL.  UTD on GYN and immunizations.  Check labs.  Anticipatory guidance provided.  

## 2017-12-20 ENCOUNTER — Telehealth: Payer: Self-pay | Admitting: Internal Medicine

## 2017-12-20 ENCOUNTER — Other Ambulatory Visit: Payer: Self-pay | Admitting: General Practice

## 2017-12-20 MED ORDER — VITAMIN D (ERGOCALCIFEROL) 1.25 MG (50000 UNIT) PO CAPS: 50000.0000 [IU] | ORAL_CAPSULE | ORAL | 0 refills | Status: DC

## 2017-12-20 MED FILL — VIT D2 1.25 MG (50,000 UNIT: 1.25 MG | 84 days supply | Qty: 12 | Fill #0

## 2017-12-20 NOTE — Telephone Encounter (Signed)
Spoke w/ pt , she feels well except for mild but persistent HA; no neck pain- dizziness. BP 122/80s, 147/99. Rec ER d/t persistent HA, declined, but will go if HA persist

## 2017-12-20 NOTE — Telephone Encounter (Signed)
Patient went home, I called, she feels better. Has not been able to recheck her BP, mild lightheadedness and a mild headache still there. Recommend to rest, ER if she has more symptoms specifically headaches.

## 2017-12-20 NOTE — Telephone Encounter (Signed)
Lisa Patterson works at [employer], I was called because she suddenly felt dizzy, her head was spinning.  Had a headache.  Reports this is not the first time that is happening usually in the context of elevated BP. On exam she was anxious, slightly tearful.  Moves all extremities, coherent,  Vital signs: BP 162/100, pulse 87, O2 sat 98. CBG 81Lungs clear to auscultation, cardiovascular regular rate and rhythm.  No edema.  After 15 minutes she felt well, essentially asymptomatic.  She has been under a lot of stress lately, she forgot to take metoprolol today. Denies any need to leave work and rest at home. For now recommend to drink plenty of fluids and call me let me know if she feels unwell again.  Family members bring her metoprolol.

## 2017-12-21 LAB — URINE CULTURE
MICRO NUMBER:: 91250734
SPECIMEN QUALITY:: ADEQUATE

## 2018-02-10 ENCOUNTER — Encounter: Payer: 59 | Admitting: Family Medicine

## 2018-02-10 ENCOUNTER — Encounter

## 2018-03-14 ENCOUNTER — Ambulatory Visit: Payer: 59 | Admitting: Family

## 2018-03-14 ENCOUNTER — Encounter: Payer: Self-pay | Admitting: Family

## 2018-03-14 VITALS — BP 122/80 | HR 101 | Temp 98.5°F | Resp 16 | Ht 61.0 in | Wt 120.0 lb

## 2018-03-14 DIAGNOSIS — J029 Acute pharyngitis, unspecified: Secondary | ICD-10-CM

## 2018-03-14 LAB — POCT RAPID STREP A (OFFICE): RAPID STREP A SCREEN: NEGATIVE

## 2018-03-14 NOTE — Progress Notes (Signed)
Subjective:    Patient ID: Lisa Patterson, female    DOB: 07-01-1970, 48 y.o.   MRN: [id number]  HPI  Patient is a 48 yr old female who presents today with chief complaint of sore throat. She reports that sore throat began yesterday. Daughter is ill with similar symptoms. Denies associated fever or nasal congestion. Concerned about possibility of strep throat.    Review of Systems See HPI  Past Medical History:  Diagnosis Date  . Hypertension   . Hypertension since 2014     Social History   Socioeconomic History  . Marital status: Married    Spouse name: Not on file  . Number of children: Not on file  . Years of education: Not on file  . Highest education level: Not on file  Occupational History  . Not on file  Social Needs  . Financial resource strain: Not on file  . Food insecurity:    Worry: Not on file    Inability: Not on file  . Transportation needs:    Medical: Not on file    Non-medical: Not on file  Tobacco Use  . Smoking status: Never Smoker  . Smokeless tobacco: Never Used  Substance and Sexual Activity  . Alcohol use: No  . Drug use: No  . Sexual activity: Not on file  Lifestyle  . Physical activity:    Days per week: Not on file    Minutes per session: Not on file  . Stress: Not on file  Relationships  . Social connections:    Talks on phone: Not on file    Gets together: Not on file    Attends religious service: Not on file    Active member of club or organization: Not on file    Attends meetings of clubs or organizations: Not on file    Relationship status: Not on file  . Intimate partner violence:    Fear of current or ex partner: Not on file    Emotionally abused: Not on file    Physically abused: Not on file    Forced sexual activity: Not on file  Other Topics Concern  . Not on file  Social History Narrative  . Not on file    No past surgical history on file.  Family History  Problem Relation Age of Onset  . Mental illness  Mother   . Asthma Mother   . Hyperlipidemia Father   . Hypertension Father   . Diabetes Father   . Diabetes Maternal Grandmother   . Cancer Maternal Grandfather        lung  . Diabetes Paternal Grandmother   . Asthma Son as child  . Cancer Brother        lung  . Cancer Maternal Aunt        breast  . Breast cancer Maternal Aunt     No Known Allergies  Current Outpatient Medications on File Prior to Visit  Medication Sig Dispense Refill  . amLODipine (NORVASC) 5 MG tablet TAKE 1 TABLET (5 MG TOTAL) BY MOUTH DAILY. 90 tablet 0  . ASPIRIN EC PO Take by mouth.    . EPINEPHrine 0.3 mg/0.3 mL IJ SOAJ injection Inject 0.3 mLs (0.3 mg total) into the muscle once. (Patient not taking: Reported on 12/19/2017) 1 Device 2  . fexofenadine (ALLEGRA) 180 MG tablet Take 180 mg by mouth 2 (two) times daily.    . metoprolol succinate (TOPROL-XL) 25 MG 24 hr tablet TAKE 1 TABLET (25 MG  TOTAL) BY MOUTH DAILY. 90 tablet 1  . omeprazole (PRILOSEC) 40 MG capsule Take 1 capsule (40 mg total) by mouth daily. 30 capsule 1  . Vitamin D, Ergocalciferol, (DRISDOL) 50000 units CAPS capsule Take 1 capsule (50,000 Units total) by mouth every 7 (seven) days. 12 capsule 0   No current facility-administered medications on file prior to visit.     BP 122/80 (BP Location: Left Arm, Patient Position: Sitting, Cuff Size: Small)   Pulse (!) 101   Temp 98.5 F (36.9 C) (Oral)   Resp 16   Ht 5\' 1"  (1.549 m)   Wt 120 lb (54.4 kg)   SpO2 99%   BMI 22.67 kg/m       Objective:   Physical Exam Constitutional:      Appearance: She is well-developed.  HENT:     Right Ear: Tympanic membrane and ear canal normal.     Left Ear: Tympanic membrane and ear canal normal.     Mouth/Throat:     Pharynx: Posterior oropharyngeal erythema present. No pharyngeal swelling, oropharyngeal exudate or uvula swelling.     Tonsils: No tonsillar exudate or tonsillar abscesses.  Neck:     Musculoskeletal: Neck supple.     Thyroid:  No thyromegaly.  Cardiovascular:     Rate and Rhythm: Normal rate and regular rhythm.     Heart sounds: Normal heart sounds. No murmur.  Pulmonary:     Effort: Pulmonary effort is normal. No respiratory distress.     Breath sounds: Normal breath sounds. No wheezing.  Lymphadenopathy:     Cervical: No cervical adenopathy.  Skin:    General: Skin is warm and dry.  Neurological:     Mental Status: She is alert and oriented to person, place, and time.  Psychiatric:        Behavior: Behavior normal.        Thought Content: Thought content normal.        Judgment: Judgment normal.           Assessment & Plan:  Viral pharyngitis- Rapid strep is negative. Pt is advised on supportive measures and follow up as below:  Call if fever >101 or if not improved in 7 days. For pain you may use motrin as needed or cepacol lozenges.

## 2018-03-14 NOTE — Patient Instructions (Signed)
  Call if fever >101 or if not improved in 7 days. For pain you may use motrin as needed or cepacol lozenges.

## 2018-03-18 ENCOUNTER — Other Ambulatory Visit (INDEPENDENT_AMBULATORY_CARE_PROVIDER_SITE_OTHER): Payer: 59

## 2018-03-18 DIAGNOSIS — R52 Pain, unspecified: Secondary | ICD-10-CM | POA: Diagnosis not present

## 2018-03-18 LAB — POC INFLUENZA A&B (BINAX/QUICKVUE)
INFLUENZA A, POC: POSITIVE — AB
Influenza B, POC: NEGATIVE

## 2018-03-18 NOTE — Progress Notes (Signed)
inf

## 2018-03-21 ENCOUNTER — Telehealth: Payer: Self-pay | Admitting: Family

## 2018-03-21 NOTE — Telephone Encounter (Signed)
Received FMLA pw for completion from [employer] via facsimile and handed them off to [facility] for completion.

## 2018-03-21 NOTE — Telephone Encounter (Signed)
Completed as much as possible; forwarded to provider/SLS 01/17

## 2018-04-04 NOTE — Telephone Encounter (Signed)
Completed paperwork was faxed with confirmation on 03/24/18//SLS

## 2018-06-09 ENCOUNTER — Other Ambulatory Visit: Payer: Self-pay | Admitting: Family Medicine

## 2018-06-09 MED FILL — METOPROLOL SUCCINATE ER 25: 25 | 90 days supply | Qty: 90 | Fill #1

## 2018-06-09 MED FILL — AMLODIPINE BESYLATE 5 MG TA: 5 | 90 days supply | Qty: 90 | Fill #0

## 2018-11-06 ENCOUNTER — Encounter: Payer: Self-pay | Admitting: Family Medicine

## 2018-11-07 MED ORDER — OMEPRAZOLE 40 MG PO CPDR: 40.0000 mg | DELAYED_RELEASE_CAPSULE | Freq: Every day | ORAL | 1 refills | Status: DC

## 2018-11-07 MED FILL — OMEPRAZOLE 40 MG CPDR: 40 | 30 days supply | Qty: 30 | Fill #0

## 2018-11-07 NOTE — Telephone Encounter (Signed)
Script never given by you do you want her to come in for evaluation before giving?

## 2018-12-25 ENCOUNTER — Other Ambulatory Visit (HOSPITAL_BASED_OUTPATIENT_CLINIC_OR_DEPARTMENT_OTHER): Payer: Self-pay | Admitting: Family Medicine

## 2018-12-25 DIAGNOSIS — Z1231 Encounter for screening mammogram for malignant neoplasm of breast: Secondary | ICD-10-CM

## 2018-12-26 ENCOUNTER — Ambulatory Visit (INDEPENDENT_AMBULATORY_CARE_PROVIDER_SITE_OTHER): Payer: 59 | Admitting: Family Medicine

## 2018-12-26 ENCOUNTER — Encounter: Payer: Self-pay | Admitting: Family Medicine

## 2018-12-26 ENCOUNTER — Other Ambulatory Visit: Payer: Self-pay

## 2018-12-26 VITALS — BP 120/78 | HR 76 | Temp 98.1°F | Resp 16 | Ht 61.0 in | Wt 123.0 lb

## 2018-12-26 DIAGNOSIS — I10 Essential (primary) hypertension: Secondary | ICD-10-CM | POA: Diagnosis not present

## 2018-12-26 DIAGNOSIS — E559 Vitamin D deficiency, unspecified: Secondary | ICD-10-CM

## 2018-12-26 DIAGNOSIS — Z0000 Encounter for general adult medical examination without abnormal findings: Secondary | ICD-10-CM | POA: Diagnosis not present

## 2018-12-26 LAB — CBC WITH DIFFERENTIAL/PLATELET
Basophils Absolute: 0 10*3/uL (ref 0.0–0.1)
Basophils Relative: 1.1 % (ref 0.0–3.0)
Eosinophils Absolute: 0 10*3/uL (ref 0.0–0.7)
Eosinophils Relative: 1 % (ref 0.0–5.0)
HCT: 39.6 % (ref 36.0–46.0)
Hemoglobin: 12.9 g/dL (ref 12.0–15.0)
Lymphocytes Relative: 35.1 % (ref 12.0–46.0)
Lymphs Abs: 1 10*3/uL (ref 0.7–4.0)
MCHC: 32.5 g/dL (ref 30.0–36.0)
MCV: 89.5 fl (ref 78.0–100.0)
Monocytes Absolute: 0.3 10*3/uL (ref 0.1–1.0)
Monocytes Relative: 12.3 % — ABNORMAL HIGH (ref 3.0–12.0)
Neutro Abs: 1.4 10*3/uL (ref 1.4–7.7)
Neutrophils Relative %: 50.5 % (ref 43.0–77.0)
Platelets: 296 10*3/uL (ref 150.0–400.0)
RBC: 4.43 Mil/uL (ref 3.87–5.11)
RDW: 13.7 % (ref 11.5–15.5)
WBC: 2.8 10*3/uL — ABNORMAL LOW (ref 4.0–10.5)

## 2018-12-26 LAB — LIPID PANEL
Cholesterol: 241 mg/dL — ABNORMAL HIGH (ref 0–200)
HDL: 75.1 mg/dL (ref >39.00)
LDL Cholesterol: 153 mg/dL — ABNORMAL HIGH (ref 0–99)
NonHDL: 165.64
Total CHOL/HDL Ratio: 3
Triglycerides: 64 mg/dL (ref 0.0–149.0)
VLDL: 12.8 mg/dL (ref 0.0–40.0)

## 2018-12-26 LAB — HEPATIC FUNCTION PANEL
ALT: 8 U/L (ref 0–35)
AST: 15 U/L (ref 0–37)
Albumin: 4.6 g/dL (ref 3.5–5.2)
Alkaline Phosphatase: 87 U/L (ref 39–117)
Bilirubin, Direct: 0.2 mg/dL (ref 0.0–0.3)
Total Bilirubin: 1.7 mg/dL — ABNORMAL HIGH (ref 0.2–1.2)
Total Protein: 7.4 g/dL (ref 6.0–8.3)

## 2018-12-26 LAB — BASIC METABOLIC PANEL
BUN: 9 mg/dL (ref 6–23)
CO2: 25 mEq/L (ref 19–32)
Calcium: 9 mg/dL (ref 8.4–10.5)
Chloride: 103 mEq/L (ref 96–112)
Creatinine, Ser: 0.7 mg/dL (ref 0.40–1.20)
GFR: 90.06 mL/min (ref >60.00)
Glucose, Bld: 95 mg/dL (ref 70–99)
Potassium: 3.8 mEq/L (ref 3.5–5.1)
Sodium: 137 mEq/L (ref 135–145)

## 2018-12-26 LAB — VITAMIN D 25 HYDROXY (VIT D DEFICIENCY, FRACTURES): VITD: 17.78 ng/mL — ABNORMAL LOW (ref 30.00–100.00)

## 2018-12-26 LAB — TSH: TSH: 1.47 u[IU]/mL (ref 0.35–4.50)

## 2018-12-26 MED ORDER — OMEPRAZOLE 40 MG PO CPDR: 40.0000 mg | DELAYED_RELEASE_CAPSULE | Freq: Every day | ORAL | 1 refills | Status: DC

## 2018-12-26 MED ORDER — METOPROLOL SUCCINATE ER 25 MG PO TB24: 25.0000 mg | ORAL_TABLET | Freq: Every day | ORAL | 1 refills | Status: DC

## 2018-12-26 MED ORDER — AMLODIPINE BESYLATE 5 MG PO TABS: 5.0000 mg | ORAL_TABLET | Freq: Every day | ORAL | 1 refills | Status: DC

## 2018-12-26 MED ORDER — CLOTRIMAZOLE 1 % EX OINT: 1.0000 "application " | TOPICAL_OINTMENT | Freq: Two times a day (BID) | CUTANEOUS | 1 refills | Status: DC

## 2018-12-26 MED FILL — ANTIFUNGAL CLOTRIMAZOLE 1 %: 1 | 14 days supply | Qty: 56 | Fill #0

## 2018-12-26 MED FILL — METOPROLOL SUCCINATE ER 25: 25 | 90 days supply | Qty: 90 | Fill #0

## 2018-12-26 MED FILL — AMLODIPINE BESYLATE 5 MG TA: 5 | 90 days supply | Qty: 90 | Fill #0

## 2018-12-26 MED FILL — OMEPRAZOLE 40 MG CPDR: 40 | 90 days supply | Qty: 90 | Fill #0

## 2018-12-26 NOTE — Progress Notes (Signed)
   Subjective:    Patient ID: Lisa Patterson, female    DOB: 09-25-1970, 48 y.o.   MRN: [id number]  HPI CPE- UTD on mammo, pap, immunizations.  No concerns today.   Review of Systems Patient reports no vision/ hearing changes, adenopathy,fever, weight change,  persistant/recurrent hoarseness , swallowing issues, chest pain, palpitations, edema, persistant/recurrent cough, hemoptysis, dyspnea (rest/exertional/paroxysmal nocturnal), gastrointestinal bleeding (melena, rectal bleeding), abdominal pain, significant heartburn, bowel changes, GU symptoms (dysuria, hematuria, incontinence), Gyn symptoms (abnormal  bleeding, pain),  syncope, focal weakness, memory loss, numbness & tingling, skin/hair/nail changes, abnormal bruising or bleeding, anxiety, or depression.     Objective:   Physical Exam General Appearance:    Alert, cooperative, no distress, appears stated age  Head:    Normocephalic, without obvious abnormality, atraumatic  Eyes:    PERRL, conjunctiva/corneas clear, EOM's intact, fundi    benign, both eyes  Ears:    Normal TM's and external ear canals, both ears  Nose:   Deferred due to COVID  Throat:   Neck:   Supple, symmetrical, trachea midline, no adenopathy;    Thyroid: no enlargement/tenderness/nodules  Back:     Symmetric, no curvature, ROM normal, no CVA tenderness  Lungs:     Clear to auscultation bilaterally, respirations unlabored  Chest Wall:    No tenderness or deformity   Heart:    Regular rate and rhythm, S1 and S2 normal, no murmur, rub   or gallop  Breast Exam:    Deferred to GYN  Abdomen:     Soft, non-tender, bowel sounds active all four quadrants,    no masses, no organomegaly  Genitalia:    Deferred to GYN  Rectal:    Extremities:   Extremities normal, atraumatic, no cyanosis or edema  Pulses:   2+ and symmetric all extremities  Skin:   Skin color, texture, turgor normal, no rashes or lesions  Lymph nodes:   Cervical, supraclavicular, and axillary nodes  normal  Neurologic:   CNII-XII intact, normal strength, sensation and reflexes    throughout          Assessment & Plan:

## 2018-12-26 NOTE — Assessment & Plan Note (Signed)
Check labs and replete prn. 

## 2018-12-26 NOTE — Assessment & Plan Note (Signed)
Chronic problem.  Well controlled.  Asymptomatic.  Check labs.  No anticipated med changes.  Will follow. 

## 2018-12-26 NOTE — Patient Instructions (Signed)
Follow up in 6 months to recheck BP We'll notify you of your lab results and make any changes if needed Keep up the good work!  You look great! Call with any questions or concerns Stay Safe!!!

## 2018-12-26 NOTE — Assessment & Plan Note (Signed)
Pt's PE WNL.  UTD on pap, mammo, immunizations.  Check labs.  Anticipatory guidance provided.  

## 2018-12-29 ENCOUNTER — Other Ambulatory Visit: Payer: Self-pay | Admitting: General Practice

## 2018-12-29 MED ORDER — VITAMIN D (ERGOCALCIFEROL) 1.25 MG (50000 UNIT) PO CAPS: 50000.0000 [IU] | ORAL_CAPSULE | ORAL | 0 refills | Status: DC

## 2018-12-29 MED FILL — VIT D2 1.25 MG (50,000 UNIT: 1.25 MG | 84 days supply | Qty: 12 | Fill #0

## 2018-12-30 ENCOUNTER — Other Ambulatory Visit: Payer: Self-pay

## 2018-12-30 ENCOUNTER — Encounter (HOSPITAL_BASED_OUTPATIENT_CLINIC_OR_DEPARTMENT_OTHER): Payer: Self-pay

## 2018-12-30 ENCOUNTER — Ambulatory Visit (HOSPITAL_BASED_OUTPATIENT_CLINIC_OR_DEPARTMENT_OTHER)
Admission: RE | Admit: 2018-12-30 | Discharge: 2018-12-30 | Disposition: A | Discharge status: Home / Self Care | Payer: 59 | Source: Ambulatory Visit | Attending: Family Medicine | Admitting: Family Medicine

## 2018-12-30 DIAGNOSIS — Z1231 Encounter for screening mammogram for malignant neoplasm of breast: Secondary | ICD-10-CM | POA: Insufficient documentation

## 2019-02-19 ENCOUNTER — Ambulatory Visit (INDEPENDENT_AMBULATORY_CARE_PROVIDER_SITE_OTHER): Payer: 59 | Admitting: Internal Medicine

## 2019-02-19 ENCOUNTER — Encounter: Payer: Self-pay | Admitting: Internal Medicine

## 2019-02-19 ENCOUNTER — Other Ambulatory Visit: Payer: Self-pay

## 2019-02-19 DIAGNOSIS — J069 Acute upper respiratory infection, unspecified: Secondary | ICD-10-CM | POA: Diagnosis not present

## 2019-02-19 NOTE — Progress Notes (Signed)
Subjective:    Patient ID: Lisa Patterson, female    DOB: May 12, 1970, 48 y.o.   MRN: [id number]  DOS:  02/19/2019 Type of visit - description: Virtual Visit via Video Note  I connected with the above patient  by a video enabled telemedicine application and verified that I am speaking with the correct person using two identifiers.   THIS ENCOUNTER IS A VIRTUAL VISIT DUE TO COVID-19 - PATIENT WAS NOT SEEN IN THE OFFICE. PATIENT HAS CONSENTED TO VIRTUAL VISIT / TELEMEDICINE VISIT   Location of patient: home  Location of provider: office  I discussed the limitations of evaluation and management by telemedicine and the availability of in person appointments. The patient expressed understanding and agreed to proceed.  History of Present Illness: Acute Symptoms started yesterday with sneezing, this morning she woke up with nose congestion, sore throat, frontal headache. She took a "Tylenol Sinus" and it has helped.  History of HTN, BP today is 142/96. Covid testing is pending     Review of Systems Denies fever chills No cough No chest pain no difficulty breathing No nausea, vomiting, diarrhea No myalgias  Past Medical History:  Diagnosis Date  . Hypertension   . Hypertension since 2014    History reviewed. No pertinent surgical history.  Social History   Socioeconomic History  . Marital status: Married    Spouse name: Not on file  . Number of children: Not on file  . Years of education: Not on file  . Highest education level: Not on file  Occupational History  . Not on file  Tobacco Use  . Smoking status: Never Smoker  . Smokeless tobacco: Never Used  Substance and Sexual Activity  . Alcohol use: No  . Drug use: No  . Sexual activity: Not on file  Other Topics Concern  . Not on file  Social History Narrative  . Not on file   Social Determinants of Health   Financial Resource Strain:   . Difficulty of Paying Living Expenses: Not on file  Food Insecurity:    . Worried About Programme researcher, broadcasting/film/video in the Last Year: Not on file  . Ran Out of Food in the Last Year: Not on file  Transportation Needs:   . Lack of Transportation (Medical): Not on file  . Lack of Transportation (Non-Medical): Not on file  Physical Activity:   . Days of Exercise per Week: Not on file  . Minutes of Exercise per Session: Not on file  Stress:   . Feeling of Stress : Not on file  Social Connections:   . Frequency of Communication with Friends and Family: Not on file  . Frequency of Social Gatherings with Friends and Family: Not on file  . Attends Religious Services: Not on file  . Active Member of Clubs or Organizations: Not on file  . Attends Banker Meetings: Not on file  . Marital Status: Not on file  Intimate Partner Violence:   . Fear of Current or Ex-Partner: Not on file  . Emotionally Abused: Not on file  . Physically Abused: Not on file  . Sexually Abused: Not on file      Allergies as of 02/19/2019   No Known Allergies     Medication List       Accurate as of February 19, 2019 11:59 PM. If you have any questions, ask your nurse or doctor.        amLODipine 5 MG tablet Commonly known as: NORVASC  Take 1 tablet (5 mg total) by mouth daily.   ASPIRIN EC PO Take by mouth.   Clotrimazole 1 % Oint Apply 1 application topically 2 (two) times daily.   EPINEPHrine 0.3 mg/0.3 mL Soaj injection Commonly known as: EPI-PEN Inject 0.3 mLs (0.3 mg total) into the muscle once.   fexofenadine 180 MG tablet Commonly known as: ALLEGRA Take 180 mg by mouth 2 (two) times daily.   metoprolol succinate 25 MG 24 hr tablet Commonly known as: TOPROL-XL Take 1 tablet (25 mg total) by mouth daily.   omeprazole 40 MG capsule Commonly known as: PRILOSEC Take 1 capsule (40 mg total) by mouth daily.   Vitamin D (Ergocalciferol) 1.25 MG (50000 UT) Caps capsule Commonly known as: DRISDOL Take 1 capsule (50,000 Units total) by mouth every 7 (seven)  days.           Objective:   Physical Exam There were no vitals taken for this visit. This is a virtual video visit, she is alert oriented x3, in no distress, she does seem to be nasally congested.    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 Covid testing has been already obtained and results are pending. Plan: Rest, fluids, Tylenol, OTC cough medicine, Flonase, avoid decongestants, call if worse, further advised with Covid results. Until Covid results are back, recommend to use a mask even at home to protect her family.       I discussed the assessment and treatment plan with the patient. The patient was provided an opportunity to ask questions and all were answered. The patient agreed with the plan and demonstrated an understanding of the instructions.   The patient was advised to call back or seek an in-person evaluation if the symptoms worsen or if the condition fails to improve as anticipated.

## 2019-04-15 MED FILL — AMLODIPINE BESYLATE 5 MG TA: 5 | 90 days supply | Qty: 90 | Fill #1

## 2019-04-15 MED FILL — METOPROLOL SUCCINATE ER 25: 25 | 90 days supply | Qty: 90 | Fill #1

## 2019-06-26 ENCOUNTER — Ambulatory Visit (INDEPENDENT_AMBULATORY_CARE_PROVIDER_SITE_OTHER): Payer: 59 | Admitting: Family Medicine

## 2019-06-26 ENCOUNTER — Other Ambulatory Visit: Payer: Self-pay

## 2019-06-26 ENCOUNTER — Encounter: Payer: Self-pay | Admitting: Family Medicine

## 2019-06-26 VITALS — BP 121/80 | HR 65 | Temp 97.9°F | Resp 16 | Ht 61.0 in | Wt 126.0 lb

## 2019-06-26 DIAGNOSIS — E785 Hyperlipidemia, unspecified: Secondary | ICD-10-CM | POA: Diagnosis not present

## 2019-06-26 DIAGNOSIS — I10 Essential (primary) hypertension: Secondary | ICD-10-CM

## 2019-06-26 LAB — CBC WITH DIFFERENTIAL/PLATELET
Basophils Absolute: 0 10*3/uL (ref 0.0–0.1)
Basophils Relative: 0.6 % (ref 0.0–3.0)
Eosinophils Absolute: 0 10*3/uL (ref 0.0–0.7)
Eosinophils Relative: 1 % (ref 0.0–5.0)
HCT: 38.2 % (ref 36.0–46.0)
Hemoglobin: 12.5 g/dL (ref 12.0–15.0)
Lymphocytes Relative: 31.8 % (ref 12.0–46.0)
Lymphs Abs: 1.1 10*3/uL (ref 0.7–4.0)
MCHC: 32.8 g/dL (ref 30.0–36.0)
MCV: 88.7 fl (ref 78.0–100.0)
Monocytes Absolute: 0.4 10*3/uL (ref 0.1–1.0)
Monocytes Relative: 11 % (ref 3.0–12.0)
Neutro Abs: 1.9 10*3/uL (ref 1.4–7.7)
Neutrophils Relative %: 55.6 % (ref 43.0–77.0)
Platelets: 257 10*3/uL (ref 150.0–400.0)
RBC: 4.31 Mil/uL (ref 3.87–5.11)
RDW: 14.1 % (ref 11.5–15.5)
WBC: 3.5 10*3/uL — ABNORMAL LOW (ref 4.0–10.5)

## 2019-06-26 LAB — HEPATIC FUNCTION PANEL
ALT: 8 U/L (ref 0–35)
AST: 15 U/L (ref 0–37)
Albumin: 4.2 g/dL (ref 3.5–5.2)
Alkaline Phosphatase: 73 U/L (ref 39–117)
Bilirubin, Direct: 0.2 mg/dL (ref 0.0–0.3)
Total Bilirubin: 1.2 mg/dL (ref 0.2–1.2)
Total Protein: 6.9 g/dL (ref 6.0–8.3)

## 2019-06-26 LAB — LIPID PANEL
Cholesterol: 215 mg/dL — ABNORMAL HIGH (ref 0–200)
HDL: 65.2 mg/dL (ref >39.00)
LDL Cholesterol: 132 mg/dL — ABNORMAL HIGH (ref 0–99)
NonHDL: 150.02
Total CHOL/HDL Ratio: 3
Triglycerides: 92 mg/dL (ref 0.0–149.0)
VLDL: 18.4 mg/dL (ref 0.0–40.0)

## 2019-06-26 LAB — BASIC METABOLIC PANEL
BUN: 7 mg/dL (ref 6–23)
CO2: 27 mEq/L (ref 19–32)
Calcium: 8.8 mg/dL (ref 8.4–10.5)
Chloride: 104 mEq/L (ref 96–112)
Creatinine, Ser: 0.73 mg/dL (ref 0.40–1.20)
GFR: 85.61 mL/min (ref >60.00)
Glucose, Bld: 88 mg/dL (ref 70–99)
Potassium: 3.9 mEq/L (ref 3.5–5.1)
Sodium: 137 mEq/L (ref 135–145)

## 2019-06-26 LAB — TSH: TSH: 1.47 u[IU]/mL (ref 0.35–4.50)

## 2019-06-26 NOTE — Progress Notes (Signed)
   Subjective:    Patient ID: Lisa Patterson, female    DOB: [birth year], 49 y.o.   MRN: [id number]  HPI HTN- chronic problem, on Amlodipine 5 mg and Metoprolol 25mg  daily w/ good control.  No CP, SOB, HAs, visual changes, edema.  Hyperlipidemia- Last LDL 153.  Attempting to control w/ diet and exercise.  Exercising regularly.  No abd pain, N/V.   Review of Systems For ROS see HPI   This visit occurred during the SARS-CoV-2 public health emergency.  Safety protocols were in place, including screening questions prior to the visit, additional usage of staff PPE, and extensive cleaning of exam room while observing appropriate contact time as indicated for disinfecting solutions.       Objective:   Physical Exam Vitals reviewed.  Constitutional:      General: She is not in acute distress.    Appearance: Normal appearance. She is well-developed.  HENT:     Head: Normocephalic and atraumatic.  Eyes:     Conjunctiva/sclera: Conjunctivae normal.     Pupils: Pupils are equal, round, and reactive to light.  Neck:     Thyroid: No thyromegaly.  Cardiovascular:     Rate and Rhythm: Normal rate and regular rhythm.     Heart sounds: Normal heart sounds. No murmur.  Pulmonary:     Effort: Pulmonary effort is normal. No respiratory distress.     Breath sounds: Normal breath sounds.  Abdominal:     General: There is no distension.     Palpations: Abdomen is soft.     Tenderness: There is no abdominal tenderness.  Musculoskeletal:     Cervical back: Normal range of motion and neck supple.  Lymphadenopathy:     Cervical: No cervical adenopathy.  Skin:    General: Skin is warm and dry.  Neurological:     Mental Status: She is alert and oriented to person, place, and time.  Psychiatric:        Behavior: Behavior normal.           Assessment & Plan:

## 2019-06-26 NOTE — Patient Instructions (Signed)
Schedule your complete physical in 6 months We'll notify you of your lab results and make any changes if needed Keep up the good work on healthy diet and regular exercise- you look great! Call with any questions or concerns Stay Safe!  Stay Healthy!!! 

## 2019-06-26 NOTE — Assessment & Plan Note (Signed)
Last LDL 153.  She has been trying to control w/ diet and exercise.  Check labs and determine if medication is needed.

## 2019-06-26 NOTE — Assessment & Plan Note (Signed)
Chronic problem.  Well controlled.  Currently asymptomatic.  Check labs.  No anticipated med changes.  Will follow. 

## 2020-01-01 ENCOUNTER — Encounter: Payer: Self-pay | Admitting: Family Medicine

## 2020-01-01 ENCOUNTER — Ambulatory Visit (INDEPENDENT_AMBULATORY_CARE_PROVIDER_SITE_OTHER): Payer: 59 | Admitting: Family Medicine

## 2020-01-01 ENCOUNTER — Other Ambulatory Visit: Payer: Self-pay

## 2020-01-01 VITALS — BP 112/80 | HR 70 | Temp 97.0°F | Resp 16 | Ht 61.0 in | Wt 125.1 lb

## 2020-01-01 DIAGNOSIS — Z1211 Encounter for screening for malignant neoplasm of colon: Secondary | ICD-10-CM

## 2020-01-01 DIAGNOSIS — E559 Vitamin D deficiency, unspecified: Secondary | ICD-10-CM

## 2020-01-01 DIAGNOSIS — Z0000 Encounter for general adult medical examination without abnormal findings: Secondary | ICD-10-CM

## 2020-01-01 DIAGNOSIS — I10 Essential (primary) hypertension: Secondary | ICD-10-CM

## 2020-01-01 LAB — CBC WITH DIFFERENTIAL/PLATELET
Basophils Absolute: 0 10*3/uL (ref 0.0–0.1)
Basophils Relative: 1.2 % (ref 0.0–3.0)
Eosinophils Absolute: 0 10*3/uL (ref 0.0–0.7)
Eosinophils Relative: 0.8 % (ref 0.0–5.0)
HCT: 39.9 % (ref 36.0–46.0)
Hemoglobin: 13 g/dL (ref 12.0–15.0)
Lymphocytes Relative: 37.8 % (ref 12.0–46.0)
Lymphs Abs: 1 10*3/uL (ref 0.7–4.0)
MCHC: 32.6 g/dL (ref 30.0–36.0)
MCV: 88 fl (ref 78.0–100.0)
Monocytes Absolute: 0.3 10*3/uL (ref 0.1–1.0)
Monocytes Relative: 10.8 % (ref 3.0–12.0)
Neutro Abs: 1.4 10*3/uL (ref 1.4–7.7)
Neutrophils Relative %: 49.4 % (ref 43.0–77.0)
Platelets: 269 10*3/uL (ref 150.0–400.0)
RBC: 4.53 Mil/uL (ref 3.87–5.11)
RDW: 14.4 % (ref 11.5–15.5)
WBC: 2.8 10*3/uL — ABNORMAL LOW (ref 4.0–10.5)

## 2020-01-01 LAB — LIPID PANEL
Cholesterol: 228 mg/dL — ABNORMAL HIGH (ref 0–200)
HDL: 76.3 mg/dL (ref >39.00)
LDL Cholesterol: 136 mg/dL — ABNORMAL HIGH (ref 0–99)
NonHDL: 151.69
Total CHOL/HDL Ratio: 3
Triglycerides: 80 mg/dL (ref 0.0–149.0)
VLDL: 16 mg/dL (ref 0.0–40.0)

## 2020-01-01 LAB — BASIC METABOLIC PANEL
BUN: 6 mg/dL (ref 6–23)
CO2: 27 mEq/L (ref 19–32)
Calcium: 8.9 mg/dL (ref 8.4–10.5)
Chloride: 104 mEq/L (ref 96–112)
Creatinine, Ser: 0.77 mg/dL (ref 0.40–1.20)
GFR: 92.08 mL/min (ref >60.00)
Glucose, Bld: 86 mg/dL (ref 70–99)
Potassium: 3.9 mEq/L (ref 3.5–5.1)
Sodium: 138 mEq/L (ref 135–145)

## 2020-01-01 LAB — HEPATIC FUNCTION PANEL
ALT: 9 U/L (ref 0–35)
AST: 16 U/L (ref 0–37)
Albumin: 4.4 g/dL (ref 3.5–5.2)
Alkaline Phosphatase: 75 U/L (ref 39–117)
Bilirubin, Direct: 0.2 mg/dL (ref 0.0–0.3)
Total Bilirubin: 1.6 mg/dL — ABNORMAL HIGH (ref 0.2–1.2)
Total Protein: 7.3 g/dL (ref 6.0–8.3)

## 2020-01-01 LAB — TSH: TSH: 1.75 u[IU]/mL (ref 0.35–4.50)

## 2020-01-01 LAB — VITAMIN D 25 HYDROXY (VIT D DEFICIENCY, FRACTURES): VITD: 19.52 ng/mL — ABNORMAL LOW (ref 30.00–100.00)

## 2020-01-01 NOTE — Patient Instructions (Signed)
Follow up in 6 months to recheck BP and cholesterol We'll notify you of your lab results and make any changes if needed Keep up the good work! You look fabulous! CALL and schedule your pap and mammo We'll call you with your GI appt Call with any questions or concerns Stay Safe!  Stay Healthy!!!

## 2020-01-01 NOTE — Assessment & Plan Note (Signed)
Pt's PE WNL.  Due for pap and mammo- pt to schedule.  Due to start colon cancer screen- referral placed.  Check labs.  Anticipatory guidance provided.

## 2020-01-01 NOTE — Progress Notes (Signed)
   Subjective:    Patient ID: Lisa Patterson, female    DOB: 1970-12-27, 49 y.o.   MRN: [id number]  HPI CPE- UTD on Tdap, flu, COVID.  Due for pap and mammo.  Due to start colon cancer screening.  Reviewed past medical, surgical, family and social histories.  Patient Care Team    Relationship Specialty Notifications Start End  Sheliah Hatch, MD PCP - General Family Medicine  12/30/13    Comment: Wilhemena Durie, Madelaine Etienne, MD Consulting Physician Obstetrics and Gynecology  10/15/16     Health Maintenance  Topic Date Due  . PAP SMEAR-Modifier  12/31/2018  . MAMMOGRAM  12/30/2019  . Hepatitis C Screening  12/31/2020 (Originally 08-02-1970)  . HIV Screening  12/31/2020 (Originally 12/01/1987)  . TETANUS/TDAP  01/21/2022  . INFLUENZA VACCINE  Completed  . COVID-19 Vaccine  Completed      Review of Systems Patient reports no vision/ hearing changes, adenopathy,fever, weight change,  persistant/recurrent hoarseness , swallowing issues, chest pain, palpitations, edema, persistant/recurrent cough, hemoptysis, dyspnea (rest/exertional/paroxysmal nocturnal), gastrointestinal bleeding (melena, rectal bleeding), abdominal pain, significant heartburn, bowel changes, GU symptoms (dysuria, hematuria, incontinence), Gyn symptoms (abnormal  bleeding, pain),  syncope, focal weakness, memory loss, numbness & tingling, skin/hair/nail changes, abnormal bruising or bleeding, anxiety, or depression.   This visit occurred during the SARS-CoV-2 public health emergency.  Safety protocols were in place, including screening questions prior to the visit, additional usage of staff PPE, and extensive cleaning of exam room while observing appropriate contact time as indicated for disinfecting solutions.       Objective:   Physical Exam General Appearance:    Alert, cooperative, no distress, appears stated age  Head:    Normocephalic, without obvious abnormality, atraumatic  Eyes:    PERRL,  conjunctiva/corneas clear, EOM's intact, fundi    benign, both eyes  Ears:    Normal TM's and external ear canals, both ears  Nose:   Nares normal, septum midline, mucosa normal, no drainage    or sinus tenderness  Throat:   Lips, mucosa, and tongue normal; teeth and gums normal  Neck:   Supple, symmetrical, trachea midline, no adenopathy;    Thyroid: no enlargement/tenderness/nodules  Back:     Symmetric, no curvature, ROM normal, no CVA tenderness  Lungs:     Clear to auscultation bilaterally, respirations unlabored  Chest Wall:    No tenderness or deformity   Heart:    Regular rate and rhythm, S1 and S2 normal, no murmur, rub   or gallop  Breast Exam:    Deferred to GYN  Abdomen:     Soft, non-tender, bowel sounds active all four quadrants,    no masses, no organomegaly  Genitalia:    Deferred to GYN  Rectal:    Extremities:   Extremities normal, atraumatic, no cyanosis or edema  Pulses:   2+ and symmetric all extremities  Skin:   Skin color, texture, turgor normal, no rashes or lesions  Lymph nodes:   Cervical, supraclavicular, and axillary nodes normal  Neurologic:   CNII-XII intact, normal strength, sensation and reflexes    throughout          Assessment & Plan:

## 2020-01-01 NOTE — Assessment & Plan Note (Signed)
Chronic problem.  Currently well controlled and asymptomatic.  Check labs.  No anticipated med changes. 

## 2020-01-01 NOTE — Assessment & Plan Note (Signed)
Pt has hx of this.  Check labs and replete prn. 

## 2020-01-04 ENCOUNTER — Ambulatory Visit: Payer: 59 | Attending: Internal Medicine

## 2020-01-04 ENCOUNTER — Other Ambulatory Visit (HOSPITAL_BASED_OUTPATIENT_CLINIC_OR_DEPARTMENT_OTHER): Payer: Self-pay | Admitting: Internal Medicine

## 2020-01-04 DIAGNOSIS — Z23 Encounter for immunization: Secondary | ICD-10-CM

## 2020-01-04 NOTE — Progress Notes (Signed)
   Covid-19 Vaccination Clinic  Name:  Lisa Patterson    MRN: [id number] DOB: 1970/11/03  01/04/2020  Lisa Patterson was observed post Covid-19 immunization for 15 minutes without incident. She was provided with Vaccine Information Sheet and instruction to access the V-Safe system.   Lisa Patterson was instructed to call 911 with any severe reactions post vaccine: Marland Kitchen Difficulty breathing  . Swelling of face and throat  . A fast heartbeat  . A bad rash all over body  . Dizziness and weakness

## 2020-01-05 ENCOUNTER — Other Ambulatory Visit: Payer: Self-pay | Admitting: General Practice

## 2020-01-05 MED ORDER — VITAMIN D (ERGOCALCIFEROL) 1.25 MG (50000 UNIT) PO CAPS: 50000.0000 [IU] | ORAL_CAPSULE | ORAL | 0 refills | Status: DC

## 2020-01-05 MED FILL — VIT D2 1.25 MG (50,000 UNIT: 1.25 MG | 84 days supply | Qty: 12 | Fill #0

## 2020-01-13 MED FILL — JANSSEN COVID-19 VACCINE 0.: 0.5 | 1 days supply | Qty: 1 | Fill #0

## 2020-02-25 ENCOUNTER — Other Ambulatory Visit: Payer: Self-pay | Admitting: Family Medicine

## 2020-02-25 MED FILL — METOPROLOL SUCCINATE ER 25: 25 | 90 days supply | Qty: 90 | Fill #0

## 2020-02-25 MED FILL — AMLODIPINE BESYLATE 5 MG TA: 5 | 90 days supply | Qty: 90 | Fill #0

## 2020-02-25 MED FILL — OMEPRAZOLE 40 MG CPDR: 40 | 90 days supply | Qty: 90 | Fill #0

## 2020-06-13 ENCOUNTER — Other Ambulatory Visit: Payer: Self-pay | Admitting: Family Medicine

## 2020-06-13 DIAGNOSIS — Z1231 Encounter for screening mammogram for malignant neoplasm of breast: Secondary | ICD-10-CM

## 2020-06-14 ENCOUNTER — Other Ambulatory Visit: Payer: Self-pay

## 2020-06-14 ENCOUNTER — Ambulatory Visit (HOSPITAL_BASED_OUTPATIENT_CLINIC_OR_DEPARTMENT_OTHER)
Admission: RE | Admit: 2020-06-14 | Discharge: 2020-06-14 | Disposition: A | Discharge status: Home / Self Care | Payer: 59 | Source: Ambulatory Visit | Attending: Family Medicine | Admitting: Family Medicine

## 2020-06-14 ENCOUNTER — Encounter (HOSPITAL_BASED_OUTPATIENT_CLINIC_OR_DEPARTMENT_OTHER): Payer: Self-pay

## 2020-06-14 DIAGNOSIS — Z1231 Encounter for screening mammogram for malignant neoplasm of breast: Secondary | ICD-10-CM | POA: Diagnosis not present

## 2020-06-24 ENCOUNTER — Other Ambulatory Visit (HOSPITAL_BASED_OUTPATIENT_CLINIC_OR_DEPARTMENT_OTHER): Payer: Self-pay

## 2020-06-24 MED FILL — Metoprolol Succinate Tab ER 24HR 25 MG (Tartrate Equiv): ORAL | 90 days supply | Qty: 90 | Fill #0 | Status: AC

## 2020-06-24 MED FILL — Amlodipine Besylate Tab 5 MG (Base Equivalent): ORAL | 90 days supply | Qty: 90 | Fill #0 | Status: AC

## 2020-07-01 ENCOUNTER — Other Ambulatory Visit: Payer: Self-pay

## 2020-07-01 ENCOUNTER — Encounter: Payer: Self-pay | Admitting: Family Medicine

## 2020-07-01 ENCOUNTER — Ambulatory Visit: Payer: 59

## 2020-07-01 ENCOUNTER — Other Ambulatory Visit (HOSPITAL_BASED_OUTPATIENT_CLINIC_OR_DEPARTMENT_OTHER): Payer: Self-pay

## 2020-07-01 ENCOUNTER — Ambulatory Visit: Payer: 59 | Admitting: Family Medicine

## 2020-07-01 VITALS — BP 130/85 | HR 94 | Temp 98.5°F | Resp 20 | Ht 61.0 in | Wt 126.0 lb

## 2020-07-01 DIAGNOSIS — E785 Hyperlipidemia, unspecified: Secondary | ICD-10-CM

## 2020-07-01 DIAGNOSIS — R233 Spontaneous ecchymoses: Secondary | ICD-10-CM

## 2020-07-01 DIAGNOSIS — N959 Unspecified menopausal and perimenopausal disorder: Secondary | ICD-10-CM | POA: Diagnosis not present

## 2020-07-01 DIAGNOSIS — I10 Essential (primary) hypertension: Secondary | ICD-10-CM | POA: Diagnosis not present

## 2020-07-01 DIAGNOSIS — R238 Other skin changes: Secondary | ICD-10-CM

## 2020-07-01 LAB — HEPATIC FUNCTION PANEL
ALT: 11 U/L (ref 0–35)
AST: 17 U/L (ref 0–37)
Albumin: 4.4 g/dL (ref 3.5–5.2)
Alkaline Phosphatase: 76 U/L (ref 39–117)
Bilirubin, Direct: 0.2 mg/dL (ref 0.0–0.3)
Total Bilirubin: 1.4 mg/dL — ABNORMAL HIGH (ref 0.2–1.2)
Total Protein: 7.1 g/dL (ref 6.0–8.3)

## 2020-07-01 LAB — CBC WITH DIFFERENTIAL/PLATELET
Basophils Absolute: 0 10*3/uL (ref 0.0–0.1)
Basophils Relative: 0.5 % (ref 0.0–3.0)
Eosinophils Absolute: 0 10*3/uL (ref 0.0–0.7)
Eosinophils Relative: 0.6 % (ref 0.0–5.0)
HCT: 39.6 % (ref 36.0–46.0)
Hemoglobin: 13.4 g/dL (ref 12.0–15.0)
Lymphocytes Relative: 22.7 % (ref 12.0–46.0)
Lymphs Abs: 0.9 10*3/uL (ref 0.7–4.0)
MCHC: 33.8 g/dL (ref 30.0–36.0)
MCV: 89.7 fl (ref 78.0–100.0)
Monocytes Absolute: 0.4 10*3/uL (ref 0.1–1.0)
Monocytes Relative: 9.8 % (ref 3.0–12.0)
Neutro Abs: 2.7 10*3/uL (ref 1.4–7.7)
Neutrophils Relative %: 66.4 % (ref 43.0–77.0)
Platelets: 286 10*3/uL (ref 150.0–400.0)
RBC: 4.41 Mil/uL (ref 3.87–5.11)
RDW: 13.6 % (ref 11.5–15.5)
WBC: 4.1 10*3/uL (ref 4.0–10.5)

## 2020-07-01 LAB — BASIC METABOLIC PANEL
BUN: 8 mg/dL (ref 6–23)
CO2: 27 mEq/L (ref 19–32)
Calcium: 9.1 mg/dL (ref 8.4–10.5)
Chloride: 104 mEq/L (ref 96–112)
Creatinine, Ser: 0.79 mg/dL (ref 0.40–1.20)
GFR: 88.97 mL/min (ref >60.00)
Glucose, Bld: 73 mg/dL (ref 70–99)
Potassium: 3.9 mEq/L (ref 3.5–5.1)
Sodium: 138 mEq/L (ref 135–145)

## 2020-07-01 LAB — LIPID PANEL
Cholesterol: 209 mg/dL — ABNORMAL HIGH (ref 0–200)
HDL: 66.6 mg/dL (ref >39.00)
LDL Cholesterol: 122 mg/dL — ABNORMAL HIGH (ref 0–99)
NonHDL: 142.7
Total CHOL/HDL Ratio: 3
Triglycerides: 104 mg/dL (ref 0.0–149.0)
VLDL: 20.8 mg/dL (ref 0.0–40.0)

## 2020-07-01 LAB — PROTIME-INR
INR: 1.1 ratio — ABNORMAL HIGH (ref 0.8–1.0)
Prothrombin Time: 11.8 s (ref 9.6–13.1)

## 2020-07-01 LAB — TSH: TSH: 1.57 u[IU]/mL (ref 0.35–4.50)

## 2020-07-01 MED ORDER — ESCITALOPRAM OXALATE 10 MG PO TABS
10.0000 mg | ORAL_TABLET | Freq: Every day | ORAL | 3 refills | Status: AC
  Filled 2020-07-01: qty 30, 30d supply, fill #0
  Filled 2020-09-02: qty 30, 30d supply, fill #1
  Filled 2020-11-15: qty 30, 30d supply, fill #2

## 2020-07-01 NOTE — Progress Notes (Signed)
Subjective:    Patient ID: Lisa Patterson, female    DOB: Feb 06, 1971, 50 y.o.   MRN: [id number]  HPI HTN- chronic problem, on Amlodipine 5mg  daily and Metoprolol 25mg  daily w/ adequate control.  No CP, SOB, HAs, visual changes, edema.  Hyperlipidemia- Last LDL 136.  Attempting to control w/ diet and exercise.  No abd pain, N/V.  Easy Bruising- pt reports that starting in February she developed a painful lump in her leg that later that day turned into a large bruise.  This happened on 2 occasions.  She has also had ruptured blood vessels in fingers that cause pain.  No longer taking ASA.  No new vitamins or supplements.  No bleeding of gums or blood in stool.  Increased sensitivity- pt isn't sure if it's hormonal, but she's taking everything very personally.  Increased tearfulness, increased irritability.  Pt reports she has never been like this.  Finds herself picking fights w/ husband.  Denies hot flashes   Review of Systems For ROS see HPI   This visit occurred during the SARS-CoV-2 public health emergency.  Safety protocols were in place, including screening questions prior to the visit, additional usage of staff PPE, and extensive cleaning of exam room while observing appropriate contact time as indicated for disinfecting solutions.       Objective:   Physical Exam Constitutional:      General: She is not in acute distress.    Appearance: Normal appearance. She is well-developed.  HENT:     Head: Normocephalic and atraumatic.  Eyes:     Conjunctiva/sclera: Conjunctivae normal.     Pupils: Pupils are equal, round, and reactive to light.  Neck:     Thyroid: No thyromegaly.  Cardiovascular:     Rate and Rhythm: Normal rate and regular rhythm.     Pulses: Normal pulses.     Heart sounds: Normal heart sounds. No murmur heard.   Pulmonary:     Effort: Pulmonary effort is normal. No respiratory distress.     Breath sounds: Normal breath sounds.  Abdominal:     General: There  is no distension.     Palpations: Abdomen is soft.     Tenderness: There is no abdominal tenderness.  Musculoskeletal:     Cervical back: Normal range of motion and neck supple.     Right lower leg: No edema.     Left lower leg: No edema.  Lymphadenopathy:     Cervical: No cervical adenopathy.  Skin:    General: Skin is warm and dry.  Neurological:     Mental Status: She is alert and oriented to person, place, and time.  Psychiatric:        Behavior: Behavior normal.           Assessment & Plan:  Perimenopausal disorder- new.  Pt is not having hot flashes or menstrual irregularities at this time, but for the first time ever she is having sadness, irritability, anxiety, tearfulness.  Discussed that this is likely hormonally triggered.  She wants to 'fix it' so we will start Lexapro and monitor closely for improvement.  Pt expressed understanding and is in agreement w/ plan.   Easy bruising- new.  Pt reports that on 2 separate episodes in the last 2 months she has had painful knots on her legs that have then become large bruises.  She has also had blood vessels burst in her fingers, causing pain and swelling.  She is not currently on ASA.  Denies  changes to medications.  No new vitamins or supplements.  Check PT and von Willibrand's.  Pt to keep a log of her sxs.  Will follow.

## 2020-07-01 NOTE — Assessment & Plan Note (Signed)
Chronic problem.  Well controlled on Amlodipine and Metoprolol.  Currently asymptomatic.  No med changes at this time

## 2020-07-01 NOTE — Assessment & Plan Note (Signed)
Ongoing issue for pt.  Has been attempting to control w/ diet and exercise.  Check labs and determine if medication is needed.

## 2020-07-01 NOTE — Patient Instructions (Addendum)
Follow up in 4-6 weeks to recheck mood We'll notify you of your lab results and make any changes if needed Keep track of the bruising and let me know if it starts happening more regularly START the Escitalopram (Lexapro) once daily for the mood swings Keep up the good work on healthy diet and regular exercise- you look great! Call with any questions or concerns Hang in there!!

## 2020-07-05 ENCOUNTER — Other Ambulatory Visit (HOSPITAL_BASED_OUTPATIENT_CLINIC_OR_DEPARTMENT_OTHER): Payer: Self-pay

## 2020-07-05 DIAGNOSIS — H16012 Central corneal ulcer, left eye: Secondary | ICD-10-CM | POA: Diagnosis not present

## 2020-07-05 LAB — VON WILLEBRAND PANEL
Factor-VIII Activity: 73 % normal (ref 50–180)
Ristocetin Co-Factor: 88 % normal (ref 42–200)
Von Willebrand Antigen, Plasma: 90 % (ref 50–217)
aPTT: 29 s (ref 23–32)

## 2020-07-05 MED ORDER — BESIVANCE 0.6 % OP SUSP
OPHTHALMIC | 3 refills | Status: DC
  Filled 2020-07-05: qty 5, 4d supply, fill #0

## 2020-07-05 MED ORDER — POLYMYXIN B-TRIMETHOPRIM 10000-0.1 UNIT/ML-% OP SOLN
OPHTHALMIC | 3 refills | Status: DC
  Filled 2020-07-05: qty 10, 90d supply, fill #0

## 2020-07-06 DIAGNOSIS — H16012 Central corneal ulcer, left eye: Secondary | ICD-10-CM | POA: Diagnosis not present

## 2020-07-06 DIAGNOSIS — H20032 Secondary infectious iridocyclitis, left eye: Secondary | ICD-10-CM | POA: Diagnosis not present

## 2020-07-07 DIAGNOSIS — H16002 Unspecified corneal ulcer, left eye: Secondary | ICD-10-CM | POA: Diagnosis not present

## 2020-07-08 ENCOUNTER — Other Ambulatory Visit (HOSPITAL_BASED_OUTPATIENT_CLINIC_OR_DEPARTMENT_OTHER): Payer: Self-pay

## 2020-07-08 DIAGNOSIS — H16012 Central corneal ulcer, left eye: Secondary | ICD-10-CM | POA: Diagnosis not present

## 2020-07-14 DIAGNOSIS — H18232 Secondary corneal edema, left eye: Secondary | ICD-10-CM | POA: Diagnosis not present

## 2020-07-14 DIAGNOSIS — H16012 Central corneal ulcer, left eye: Secondary | ICD-10-CM | POA: Diagnosis not present

## 2020-08-11 ENCOUNTER — Telehealth: Payer: 59 | Admitting: Family Medicine

## 2020-08-13 DIAGNOSIS — H5213 Myopia, bilateral: Secondary | ICD-10-CM | POA: Diagnosis not present

## 2020-08-13 DIAGNOSIS — H52223 Regular astigmatism, bilateral: Secondary | ICD-10-CM | POA: Diagnosis not present

## 2020-08-31 ENCOUNTER — Encounter: Payer: Self-pay | Admitting: *Deleted

## 2020-09-02 ENCOUNTER — Encounter: Payer: Self-pay | Admitting: Family Medicine

## 2020-09-02 ENCOUNTER — Telehealth (INDEPENDENT_AMBULATORY_CARE_PROVIDER_SITE_OTHER): Payer: 59 | Admitting: Family Medicine

## 2020-09-02 ENCOUNTER — Other Ambulatory Visit (HOSPITAL_BASED_OUTPATIENT_CLINIC_OR_DEPARTMENT_OTHER): Payer: Self-pay

## 2020-09-02 ENCOUNTER — Other Ambulatory Visit: Payer: Self-pay

## 2020-09-02 DIAGNOSIS — N959 Unspecified menopausal and perimenopausal disorder: Secondary | ICD-10-CM

## 2020-09-02 NOTE — Progress Notes (Signed)
I connected with  Lisa Patterson on 09/02/20 by a video enabled telemedicine application and verified that I am speaking with the correct person using two identifiers.   I discussed the limitations of evaluation and management by telemedicine. The patient expressed understanding and agreed to proceed.

## 2020-09-02 NOTE — Progress Notes (Signed)
   Virtual Visit via Video   I connected with patient on 09/02/20 at  1:00 PM EDT by a video enabled telemedicine application and verified that I am speaking with the correct person using two identifiers.  Location patient: Home Location provider: [facility], Office Persons participating in the virtual visit: Patient, Provider, CMA (Sabrina M)  I discussed the limitations of evaluation and management by telemedicine and the availability of in person appointments. The patient expressed understanding and agreed to proceed.  Subjective:   HPI:   Perimenopause/Anxiety- pt was started on Lexapro 10mg  at last visit.  Feels she is doing 'a lot better'.  No longer tearful, mood is better.  Is no longer feeling jealous or concerned about her husband.  Pt feels much more calm and easier to think through things.  Feels dose is appropriate and no need to change prescription.  ROS:   See pertinent positives and negatives per HPI.  Patient Active Problem List   Diagnosis Date Noted   Hyperlipidemia 06/26/2019   Vitamin D deficiency 11/09/2016   Idiopathic urticaria 12/01/2014   Physical exam 11/02/2014   Angioedema 11/02/2014   Breast mass, right 04/08/2014   Cold sore 02/08/2014   HTN (hypertension) 01/21/2014    Social History   Tobacco Use   Smoking status: Never   Smokeless tobacco: Never  Substance Use Topics   Alcohol use: No    Current Outpatient Medications:    amLODipine (NORVASC) 5 MG tablet, TAKE 1 TABLET BY MOUTH ONCE DAILY, Disp: 90 tablet, Rfl: 1   Besifloxacin HCl (BESIVANCE) 0.6 % SUSP, place 1 drop into the left eye every 1 hour, Disp: 5 mL, Rfl: 3   COVID-19 Ad26 vaccine, JANSSEN/J&J, 0.5 ML injection, INJECT AS DIRECTED, Disp: .5 mL, Rfl: 0   escitalopram (LEXAPRO) 10 MG tablet, Take 1 tablet (10 mg total) by mouth daily., Disp: 30 tablet, Rfl: 3   metoprolol succinate (TOPROL-XL) 25 MG 24 hr tablet, TAKE 1 TABLET BY MOUTH ONCE DAILY, Disp: 90 tablet, Rfl:  1   omeprazole (PRILOSEC) 40 MG capsule, TAKE 1 CAPSULE BY MOUTH ONCE DAILY, Disp: 90 capsule, Rfl: 1   trimethoprim-polymyxin b (POLYTRIM) ophthalmic solution, place 1 drop into the left eye everyday (Patient not taking: Reported on 09/02/2020), Disp: 10 mL, Rfl: 3  No Known Allergies  Objective:   There were no vitals taken for this visit. AAOx3, NAD NCAT, EOMI No obvious CN deficits Coloring WNL Pt is able to speak clearly, coherently without shortness of breath or increased work of breathing.  Thought process is linear.  Mood is appropriate.   Assessment and Plan:   Perimenopause- pt reports feeling much better since starting Lexapro.  Less irritable, less tearful, more calm, and easier to think things through.  No changes at this time.  Will follow.   11/03/2020, MD 09/02/2020

## 2020-11-04 ENCOUNTER — Other Ambulatory Visit (HOSPITAL_BASED_OUTPATIENT_CLINIC_OR_DEPARTMENT_OTHER): Payer: Self-pay

## 2020-11-15 ENCOUNTER — Other Ambulatory Visit (HOSPITAL_BASED_OUTPATIENT_CLINIC_OR_DEPARTMENT_OTHER): Payer: Self-pay

## 2020-11-15 ENCOUNTER — Other Ambulatory Visit: Payer: Self-pay | Admitting: Family Medicine

## 2020-11-15 MED ORDER — METOPROLOL SUCCINATE ER 25 MG PO TB24
ORAL_TABLET | Freq: Every day | ORAL | 1 refills | Status: DC
  Filled 2020-11-15: qty 90, 90d supply, fill #0
  Filled 2021-08-28: qty 90, 90d supply, fill #1

## 2020-11-15 MED ORDER — AMLODIPINE BESYLATE 5 MG PO TABS
ORAL_TABLET | Freq: Every day | ORAL | 1 refills | Status: DC
  Filled 2020-11-15: qty 90, 90d supply, fill #0
  Filled 2021-08-28: qty 90, 90d supply, fill #1

## 2020-11-21 ENCOUNTER — Other Ambulatory Visit (HOSPITAL_BASED_OUTPATIENT_CLINIC_OR_DEPARTMENT_OTHER): Payer: Self-pay

## 2020-11-21 ENCOUNTER — Ambulatory Visit: Payer: 59 | Admitting: Family Medicine

## 2020-11-21 ENCOUNTER — Encounter: Payer: Self-pay | Admitting: Family Medicine

## 2020-11-21 ENCOUNTER — Other Ambulatory Visit: Payer: Self-pay

## 2020-11-21 VITALS — BP 122/80 | HR 120 | Temp 98.1°F | Ht 61.0 in | Wt 125.0 lb

## 2020-11-21 DIAGNOSIS — R110 Nausea: Secondary | ICD-10-CM

## 2020-11-21 DIAGNOSIS — R1031 Right lower quadrant pain: Secondary | ICD-10-CM

## 2020-11-21 LAB — CBC
HCT: 39 % (ref 36.0–46.0)
Hemoglobin: 13 g/dL (ref 12.0–15.0)
MCHC: 33.4 g/dL (ref 30.0–36.0)
MCV: 88.5 fl (ref 78.0–100.0)
Platelets: 293 10*3/uL (ref 150.0–400.0)
RBC: 4.41 Mil/uL (ref 3.87–5.11)
RDW: 13.9 % (ref 11.5–15.5)
WBC: 10.7 10*3/uL — ABNORMAL HIGH (ref 4.0–10.5)

## 2020-11-21 MED ORDER — ONDANSETRON 4 MG PO TBDP
4.0000 mg | ORAL_TABLET | Freq: Three times a day (TID) | ORAL | 0 refills | Status: DC | PRN
  Filled 2020-11-21: qty 20, 7d supply, fill #0

## 2020-11-21 MED ORDER — DICYCLOMINE HCL 10 MG PO CAPS
ORAL_CAPSULE | ORAL | 0 refills | Status: AC
  Filled 2020-11-21: qty 60, 15d supply, fill #0

## 2020-11-21 NOTE — Progress Notes (Signed)
Chief Complaint  Patient presents with   Flank Pain    Right side   Nausea   Diarrhea     Subjective Lisa Patterson is a 50 y.o. female who presents with nausea and diarrhea Symptoms began 1 d ago.  Patient has RLQ, cramping, diarrhea, slight mucus in stool, and nausea Patient denies vomiting, fever, and bleeding Treatment to date: none Sick contacts: none known No recent travel or abx use, med changes.   Past Medical History:  Diagnosis Date   Hypertension    Hypertension since 2014    Exam BP 122/80   Pulse (!) 120   Temp 98.1 F (36.7 C) (Oral)   Ht 5\' 1"  (1.549 m)   Wt 125 lb (56.7 kg)   SpO2 98%   BMI 23.62 kg/m  General:  well developed, well hydrated, in no apparent distress Skin:  warm, no pallor or diaphoresis, no rashes Throat/Pharynx:  lips and gingiva without lesion; tongue and uvula midline; non-inflamed pharynx; no exudates or postnasal drainage Lungs:  clear to auscultation, breath sounds equal bilaterally, no respiratory distress, no wheezes Cardio:  reg rhythm, tachycardic Abdomen:  abdomen soft, ttp in RLQ; bowel sounds normal; no masses or organomegaly; + McBurney's, neg heel string, obturator sign, Carnett's, Murphy's Psych: Appropriate judgement/insight  Assessment and Plan  RLQ abdominal pain - Plan: CBC, dicyclomine (BENTYL) 10 MG capsule  Nausea - Plan: ondansetron (ZOFRAN-ODT) 4 MG disintegrating tablet  No leukocytosis. Will monitor. Spoke w pt regarding results. If worsening, will go to ER. If not improving, will want to ck CT abd/pel w contrast. I think this is likely gastroenteritis given stool patterns though.  The patient voiced understanding and agreement to the plan.  [place], DO 11/21/20  10:22 AM

## 2020-11-21 NOTE — Patient Instructions (Signed)
Give [place] 2-3 business days to get the results of your labs back.   We will be in touch regarding your lab work.   Let us know if you need anything.

## 2020-12-09 ENCOUNTER — Other Ambulatory Visit (HOSPITAL_BASED_OUTPATIENT_CLINIC_OR_DEPARTMENT_OTHER): Payer: Self-pay

## 2021-01-19 ENCOUNTER — Ambulatory Visit (INDEPENDENT_AMBULATORY_CARE_PROVIDER_SITE_OTHER): Payer: 59 | Admitting: Family Medicine

## 2021-01-19 ENCOUNTER — Other Ambulatory Visit (HOSPITAL_BASED_OUTPATIENT_CLINIC_OR_DEPARTMENT_OTHER): Payer: Self-pay

## 2021-01-19 ENCOUNTER — Encounter: Payer: Self-pay | Admitting: Family Medicine

## 2021-01-19 ENCOUNTER — Other Ambulatory Visit: Payer: Self-pay

## 2021-01-19 VITALS — BP 130/82 | HR 82 | Temp 98.2°F | Resp 16 | Ht 61.0 in | Wt 129.0 lb

## 2021-01-19 DIAGNOSIS — I10 Essential (primary) hypertension: Secondary | ICD-10-CM

## 2021-01-19 DIAGNOSIS — Z0000 Encounter for general adult medical examination without abnormal findings: Secondary | ICD-10-CM

## 2021-01-19 DIAGNOSIS — Z1211 Encounter for screening for malignant neoplasm of colon: Secondary | ICD-10-CM

## 2021-01-19 DIAGNOSIS — E559 Vitamin D deficiency, unspecified: Secondary | ICD-10-CM

## 2021-01-19 LAB — LIPID PANEL
Cholesterol: 242 mg/dL — ABNORMAL HIGH (ref 0–200)
HDL: 70.5 mg/dL (ref >39.00)
LDL Cholesterol: 147 mg/dL — ABNORMAL HIGH (ref 0–99)
NonHDL: 171.23
Total CHOL/HDL Ratio: 3
Triglycerides: 123 mg/dL (ref 0.0–149.0)
VLDL: 24.6 mg/dL (ref 0.0–40.0)

## 2021-01-19 LAB — CBC WITH DIFFERENTIAL/PLATELET
Basophils Absolute: 0 10*3/uL (ref 0.0–0.1)
Basophils Relative: 0.5 % (ref 0.0–3.0)
Eosinophils Absolute: 0 10*3/uL (ref 0.0–0.7)
Eosinophils Relative: 0.6 % (ref 0.0–5.0)
HCT: 40 % (ref 36.0–46.0)
Hemoglobin: 13.1 g/dL (ref 12.0–15.0)
Lymphocytes Relative: 22.1 % (ref 12.0–46.0)
Lymphs Abs: 1.2 10*3/uL (ref 0.7–4.0)
MCHC: 32.6 g/dL (ref 30.0–36.0)
MCV: 89.4 fl (ref 78.0–100.0)
Monocytes Absolute: 0.4 10*3/uL (ref 0.1–1.0)
Monocytes Relative: 7.3 % (ref 3.0–12.0)
Neutro Abs: 3.8 10*3/uL (ref 1.4–7.7)
Neutrophils Relative %: 69.5 % (ref 43.0–77.0)
Platelets: 268 10*3/uL (ref 150.0–400.0)
RBC: 4.48 Mil/uL (ref 3.87–5.11)
RDW: 14.2 % (ref 11.5–15.5)
WBC: 5.5 10*3/uL (ref 4.0–10.5)

## 2021-01-19 LAB — HEPATIC FUNCTION PANEL
ALT: 8 U/L (ref 0–35)
AST: 13 U/L (ref 0–37)
Albumin: 4.5 g/dL (ref 3.5–5.2)
Alkaline Phosphatase: 77 U/L (ref 39–117)
Bilirubin, Direct: 0.2 mg/dL (ref 0.0–0.3)
Total Bilirubin: 1.8 mg/dL — ABNORMAL HIGH (ref 0.2–1.2)
Total Protein: 7.3 g/dL (ref 6.0–8.3)

## 2021-01-19 LAB — BASIC METABOLIC PANEL
BUN: 7 mg/dL (ref 6–23)
CO2: 26 mEq/L (ref 19–32)
Calcium: 8.9 mg/dL (ref 8.4–10.5)
Chloride: 104 mEq/L (ref 96–112)
Creatinine, Ser: 0.76 mg/dL (ref 0.40–1.20)
GFR: 92.84 mL/min (ref >60.00)
Glucose, Bld: 83 mg/dL (ref 70–99)
Potassium: 3.9 mEq/L (ref 3.5–5.1)
Sodium: 136 mEq/L (ref 135–145)

## 2021-01-19 LAB — TSH: TSH: 1.47 u[IU]/mL (ref 0.35–5.50)

## 2021-01-19 LAB — VITAMIN D 25 HYDROXY (VIT D DEFICIENCY, FRACTURES): VITD: 14.51 ng/mL — ABNORMAL LOW (ref 30.00–100.00)

## 2021-01-19 MED ORDER — ALBUTEROL SULFATE HFA 108 (90 BASE) MCG/ACT IN AERS
2.0000 | INHALATION_SPRAY | Freq: Four times a day (QID) | RESPIRATORY_TRACT | 3 refills | Status: AC | PRN
  Filled 2021-01-19: qty 8.5, 30d supply, fill #0

## 2021-01-19 NOTE — Assessment & Plan Note (Signed)
Pt's PE WNL.  UTD on mammo.  Due for pap- pt to call and schedule.  Referral for GI placed for colonoscopy.  UTD on flu, Tdap.  Check labs.  Anticipatory guidance provided.

## 2021-01-19 NOTE — Assessment & Plan Note (Signed)
Pt has hx of similar.  Check labs and replete prn. °

## 2021-01-19 NOTE — Patient Instructions (Signed)
Follow up in 6 months to recheck BP We'll notify you of your lab results and make any changes if needed Keep up the good work on healthy diet and regular exercise- you look great!!! Call and schedule your pap We'll call you with your GI appt for the colonoscopy Call with any questions or concerns Stay Safe!  Stay Healthy! Happy Holidays!

## 2021-01-19 NOTE — Assessment & Plan Note (Signed)
Chronic problem.  Adequate control.  Asymptomatic.  Check labs.  No anticipated med changes.  Will follow. 

## 2021-01-19 NOTE — Progress Notes (Signed)
   Subjective:    Patient ID: Lisa Patterson, female    DOB: September 08, 1970, 50 y.o.   MRN: [id number]  HPI CPE- UTD on mammo.  Due for pap, due for colon cancer screen.    Patient Care Team    Relationship Specialty Notifications Start End  Sheliah Hatch, MD PCP - General Family Medicine  12/30/13    Comment: Wilhemena Durie, Madelaine Etienne, MD Consulting Physician Obstetrics and Gynecology  10/15/16     Health Maintenance  Topic Date Due   HIV Screening  Never done   Hepatitis C Screening  Never done   COLONOSCOPY (Pts 45-30yrs Insurance coverage will need to be confirmed)  Never done   PAP SMEAR-Modifier  12/31/2018   COVID-19 Vaccine (3 - Booster for Janssen series) 02/29/2020   MAMMOGRAM  06/14/2021   TETANUS/TDAP  01/21/2022   INFLUENZA VACCINE  Completed   Pneumococcal Vaccine 86-30 Years old  Aged Out   HPV VACCINES  Aged Out      Review of Systems Patient reports no vision/hearing changes, adenopathy,fever, weight change, persistant/recurrent hoarseness , swallowing issues, chest pain, palpitations, edema, hemoptysis, dyspnea (rest/exertional/paroxysmal nocturnal), gastrointestinal bleeding (melena, rectal bleeding), abdominal pain, significant heartburn, bowel changes, GU symptoms (dysuria, hematuria, incontinence), Gyn symptoms (abnormal  bleeding, pain),  syncope, focal weakness, memory loss, numbness & tingling, skin/hair/nail changes, abnormal bruising or bleeding, anxiety, or depression.   + nightly cough- lasts for 20-30 minutes and then resolves.  Occurs daily  This visit occurred during the SARS-CoV-2 public health emergency.  Safety protocols were in place, including screening questions prior to the visit, additional usage of staff PPE, and extensive cleaning of exam room while observing appropriate contact time as indicated for disinfecting solutions.      Objective:   Physical Exam General Appearance:    Alert, cooperative, no distress, appears stated age   Head:    Normocephalic, without obvious abnormality, atraumatic  Eyes:    PERRL, conjunctiva/corneas clear, EOM's intact, fundi    benign, both eyes  Ears:    Normal TM's and external ear canals, both ears  Nose:   Deferred due to COVID  Throat:   Neck:   Supple, symmetrical, trachea midline, no adenopathy;    Thyroid: no enlargement/tenderness/nodules  Back:     Symmetric, no curvature, ROM normal, no CVA tenderness  Lungs:     Clear to auscultation bilaterally, respirations unlabored  Chest Wall:    No tenderness or deformity   Heart:    Regular rate and rhythm, S1 and S2 normal, no murmur, rub   or gallop  Breast Exam:    Deferred to GYN  Abdomen:     Soft, non-tender, bowel sounds active all four quadrants,    no masses, no organomegaly  Genitalia:    Deferred to GYN  Rectal:    Extremities:   Extremities normal, atraumatic, no cyanosis or edema  Pulses:   2+ and symmetric all extremities  Skin:   Skin color, texture, turgor normal, no rashes or lesions  Lymph nodes:   Cervical, supraclavicular, and axillary nodes normal  Neurologic:   CNII-XII intact, normal strength, sensation and reflexes    throughout          Assessment & Plan:

## 2021-01-20 ENCOUNTER — Telehealth: Payer: Self-pay

## 2021-01-20 NOTE — Telephone Encounter (Signed)
Pt called back and was informed pf results

## 2021-01-20 NOTE — Telephone Encounter (Signed)
-----   Message from Sheliah Hatch, MD sent at 01/20/2021  7:28 AM EST ----- Labs look good w/ exception of low Vit D.  Based on this, we need to start 50,000 units weekly x12 weeks in addition to daily OTC supplement of at least 2000 units.   Also, total cholesterol and LDL (bad cholesterol) are both higher than last check.  This will improve w/ healthy diet and regular exercise, and in the meantime, you can add daily OTC Red Yeast Rice supplement to improve these numbers.  Thankfully the ratio of good to bad is still excellent!

## 2021-01-25 ENCOUNTER — Other Ambulatory Visit (HOSPITAL_BASED_OUTPATIENT_CLINIC_OR_DEPARTMENT_OTHER): Payer: Self-pay

## 2021-01-25 ENCOUNTER — Other Ambulatory Visit: Payer: Self-pay | Admitting: Family Medicine

## 2021-01-30 ENCOUNTER — Other Ambulatory Visit (HOSPITAL_BASED_OUTPATIENT_CLINIC_OR_DEPARTMENT_OTHER): Payer: Self-pay

## 2021-01-30 ENCOUNTER — Encounter: Payer: Self-pay | Admitting: Family Medicine

## 2021-01-30 MED ORDER — VITAMIN D (ERGOCALCIFEROL) 1.25 MG (50000 UNIT) PO CAPS
50000.0000 [IU] | ORAL_CAPSULE | ORAL | 0 refills | Status: DC
  Filled 2021-01-30: qty 12, 84d supply, fill #0

## 2021-02-17 ENCOUNTER — Other Ambulatory Visit: Payer: Self-pay

## 2021-02-17 ENCOUNTER — Encounter: Payer: Self-pay | Admitting: Gastroenterology

## 2021-02-17 ENCOUNTER — Ambulatory Visit: Payer: 59 | Admitting: Gastroenterology

## 2021-02-17 VITALS — BP 128/82 | HR 105 | Ht 61.0 in | Wt 132.2 lb

## 2021-02-17 DIAGNOSIS — Z1211 Encounter for screening for malignant neoplasm of colon: Secondary | ICD-10-CM

## 2021-02-17 NOTE — Progress Notes (Signed)
Chief Complaint: For colon  Referring Provider:  Sheliah Hatch, MD      ASSESSMENT AND PLAN;   #1.  Colorectal cancer screening   Plan: -colon for further eval    Discussed risks & benefits of colonoscopy. Risks including rare perforation req laparotomy, bleeding after bx/polypectomy req blood transfusion, rarely missing neoplasms, risks of anesthesia/sedation, rare risk of damage to internal organs. Benefits outweigh the risks. Patient agrees to proceed. All the questions were answered. Pt consents to proceed.   HPI:    Lisa Patterson is a 50 y.o. female  Well known to [place] Works at [employer] (she would check in GI pts when we had clinic downstairs)  No nausea, vomiting, heartburn, regurgitation, odynophagia or dysphagia.  No significant diarrhea or constipation.  No melena or hematochezia. No unintentional weight loss. No abdominal pain.  Had IBS-D, esp when she eats out. Rare constipation. Better with sleep. Worst when she is under stress. No nocturnal symptoms.  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  Past Medical History:  Diagnosis Date   Hypertension    Hypertension since 2014    History reviewed. No pertinent surgical history.  Family History  Problem Relation Age of Onset   Mental illness Mother    Asthma Mother    Hyperlipidemia Father    Hypertension Father    Diabetes Father    Cancer Brother        lung   Diabetes Maternal Grandmother    Cancer Maternal Grandfather        lung   Diabetes Paternal Grandmother    Asthma Son 0   Cancer Maternal Aunt        breast   Breast cancer Maternal Aunt    Colon cancer Cousin    Pancreatic cancer Neg Hx    Esophageal cancer Neg Hx    Stomach cancer Neg Hx     Social History   Tobacco Use   Smoking status: Never   Smokeless tobacco: Never  Vaping Use   Vaping Use: Never used  Substance Use Topics   Alcohol use: No   Drug use: No    Current Outpatient Medications  Medication Sig  Dispense Refill   albuterol (VENTOLIN HFA) 108 (90 Base) MCG/ACT inhaler Inhale 2 puffs into the lungs every 6 (six) hours as needed for wheezing or shortness of breath. 8.5 g 3   amLODipine (NORVASC) 5 MG tablet TAKE 1 TABLET BY MOUTH ONCE DAILY 90 tablet 1   dicyclomine (BENTYL) 10 MG capsule Take 1 tablet by mouth every 6 hours as needed for abdominal cramping. 60 capsule 0   escitalopram (LEXAPRO) 10 MG tablet Take 1 tablet (10 mg total) by mouth daily. 30 tablet 3   metoprolol succinate (TOPROL-XL) 25 MG 24 hr tablet TAKE 1 TABLET BY MOUTH ONCE DAILY 90 tablet 1   omeprazole (PRILOSEC) 40 MG capsule TAKE 1 CAPSULE BY MOUTH ONCE DAILY (Patient taking differently: Take by mouth daily as needed.) 90 capsule 1   Vitamin D, Ergocalciferol, (DRISDOL) 1.25 MG (50000 UNIT) CAPS capsule Take 1 capsule (50,000 Units total) by mouth every 7 (seven) days. 12 capsule 0   No current facility-administered medications for this visit.    No Known Allergies  Review of Systems:  Constitutional: Denies fever, chills, diaphoresis, appetite change and fatigue.  HEENT: Denies photophobia, eye pain, redness, hearing loss, ear pain, congestion, sore throat, rhinorrhea, sneezing, mouth sores, neck pain, neck stiffness and tinnitus.   Respiratory: Denies SOB, DOE,  cough, chest tightness,  and wheezing.   Cardiovascular: Denies chest pain, palpitations and leg swelling.  Genitourinary: Denies dysuria, urgency, frequency, hematuria, flank pain and difficulty urinating.  Musculoskeletal: Denies myalgias, back pain, joint swelling, arthralgias and gait problem.  Skin: No rash.  Neurological: Denies dizziness, seizures, syncope, weakness, light-headedness, numbness and headaches.  Hematological: Denies adenopathy. Easy bruising, personal or family bleeding history  Psychiatric/Behavioral: Has anxiety, no depression     Physical Exam:    BP 128/82    Pulse (!) 105    Ht 5\' 1"  (1.549 m)    Wt 132 lb 4 oz (60 kg)     SpO2 98%    BMI 24.99 kg/m  Wt Readings from Last 3 Encounters:  02/17/21 132 lb 4 oz (60 kg)  01/19/21 129 lb (58.5 kg)  11/21/20 125 lb (56.7 kg)   Constitutional:  Well-developed, in no acute distress. Psychiatric: Normal mood and affect. Behavior is normal. HEENT: Pupils normal.  Conjunctivae are normal. No scleral icterus.  Cardiovascular: Normal rate, regular rhythm. No edema Pulmonary/chest: Effort normal and breath sounds normal. No wheezing, rales or rhonchi. Abdominal: Soft, nondistended. Nontender. Bowel sounds active throughout. There are no masses palpable. No hepatomegaly. Rectal: Deferred Neurological: Alert and oriented to person place and time. Skin: Skin is warm and dry. No rashes noted.  Data Reviewed: I have personally reviewed following labs and imaging studies  CBC: CBC Latest Ref Rng & Units 01/19/2021 11/21/2020 07/01/2020  WBC 4.0 - 10.5 K/uL 5.5 10.7(H) 4.1  Hemoglobin 12.0 - 15.0 g/dL 13.1 13.0 13.4  Hematocrit 36.0 - 46.0 % 40.0 39.0 39.6  Platelets 150.0 - 400.0 K/uL 268.0 293.0 286.0    CMP: CMP Latest Ref Rng & Units 01/19/2021 07/01/2020 01/01/2020  Glucose 70 - 99 mg/dL 83 73 86  BUN 6 - 23 mg/dL 7 8 6   Creatinine 0.40 - 1.20 mg/dL 0.76 0.79 0.77  Sodium 135 - 145 mEq/L 136 138 138  Potassium 3.5 - 5.1 mEq/L 3.9 3.9 3.9  Chloride 96 - 112 mEq/L 104 104 104  CO2 19 - 32 mEq/L 26 27 27   Calcium 8.4 - 10.5 mg/dL 8.9 9.1 8.9  Total Protein 6.0 - 8.3 g/dL 7.3 7.1 7.3  Total Bilirubin 0.2 - 1.2 mg/dL 1.8(H) 1.4(H) 1.6(H)  Alkaline Phos 39 - 117 U/L 77 76 75  AST 0 - 37 U/L 13 17 16   ALT 0 - 35 U/L 8 11 9        Carmell Austria, MD 02/17/2021, 8:36 AM  Cc: Midge Minium, MD

## 2021-02-17 NOTE — Patient Instructions (Addendum)
If you are age 48 or older, your body mass index should be between 23-30. Your Body mass index is 24.99 kg/m. If this is out of the aforementioned range listed, please consider follow up with your Primary Care Provider.  If you are age 21 or younger, your body mass index should be between 19-25. Your Body mass index is 24.99 kg/m. If this is out of the aformentioned range listed, please consider follow up with your Primary Care Provider.   ________________________________________________________  The [facility] providers would like to encourage you to use [facility] to communicate with providers for non-urgent requests or questions.  Due to long hold times on the telephone, sending your provider a message by [facility] may be a faster and more efficient way to get a response.  Please allow 48 business hours for a response.  Please remember that this is for non-urgent requests.  _______________________________________________________  We have given you samples of the following medication to take: Clenpiq  You have been scheduled for a colonoscopy. Please follow written instructions given to you at your visit today.  Please pick up your prep supplies at the pharmacy within the next 1-3 days. If you use inhalers (even only as needed), please bring them with you on the day of your procedure.  Thank you,  Dr. Lynann Bologna

## 2021-02-23 ENCOUNTER — Other Ambulatory Visit (HOSPITAL_BASED_OUTPATIENT_CLINIC_OR_DEPARTMENT_OTHER): Payer: Self-pay

## 2021-03-10 DIAGNOSIS — Z6824 Body mass index (BMI) 24.0-24.9, adult: Secondary | ICD-10-CM | POA: Diagnosis not present

## 2021-03-10 DIAGNOSIS — Z01419 Encounter for gynecological examination (general) (routine) without abnormal findings: Secondary | ICD-10-CM | POA: Diagnosis not present

## 2021-03-13 LAB — RESULTS CONSOLE HPV: CHL HPV: NEGATIVE

## 2021-03-13 LAB — HM PAP SMEAR: HM Pap smear: NORMAL

## 2021-03-23 ENCOUNTER — Ambulatory Visit (AMBULATORY_SURGERY_CENTER): Payer: 59 | Admitting: Gastroenterology

## 2021-03-23 ENCOUNTER — Encounter: Payer: Self-pay | Admitting: Gastroenterology

## 2021-03-23 ENCOUNTER — Other Ambulatory Visit: Payer: Self-pay

## 2021-03-23 VITALS — BP 123/76 | HR 68 | Temp 98.4°F | Resp 14 | Ht 61.0 in | Wt 132.0 lb

## 2021-03-23 DIAGNOSIS — Z1211 Encounter for screening for malignant neoplasm of colon: Secondary | ICD-10-CM | POA: Diagnosis not present

## 2021-03-23 MED ORDER — SODIUM CHLORIDE 0.9 % IV SOLN: 500.0000 mL | Freq: Once | INTRAVENOUS | Status: DC

## 2021-03-23 NOTE — Op Note (Signed)
[facility] Patient Name: Lisa Patterson Procedure Date: 03/23/2021 9:51 AM MRN: [id number] Endoscopist: Lynann Bologna , MD Age: 51 Referring MD:  Date of Birth: 1971-02-12 Gender: Female Account #: [id number] Procedure:                Colonoscopy Indications:              Screening for colorectal malignant neoplasm Medicines:                Monitored Anesthesia Care Procedure:                Pre-Anesthesia Assessment:                           - Prior to the procedure, a History and Physical                            was performed, and patient medications and                            allergies were reviewed. The patient's tolerance of                            previous anesthesia was also reviewed. The risks                            and benefits of the procedure and the sedation                            options and risks were discussed with the patient.                            All questions were answered, and informed consent                            was obtained. Prior Anticoagulants: The patient has                            taken no previous anticoagulant or antiplatelet                            agents. ASA Grade Assessment: I - A normal, healthy                            patient. After reviewing the risks and benefits,                            the patient was deemed in satisfactory condition to                            undergo the procedure.                           After obtaining informed consent, the colonoscope  was passed under direct vision. Throughout the                            procedure, the patient's blood pressure, pulse, and                            oxygen saturations were monitored continuously. The                            PCF-HQ190L Colonoscope was introduced through the                            anus and advanced to the 2 cm into the ileum. The                            colonoscopy was performed  without difficulty. The                            patient tolerated the procedure well. The quality                            of the bowel preparation was excellent. The                            terminal ileum, ileocecal valve, appendiceal                            orifice, and rectum were photographed. Scope In: 10:01:34 AM Scope Out: 10:11:12 AM Scope Withdrawal Time: 0 hours 6 minutes 38 seconds  Total Procedure Duration: 0 hours 9 minutes 38 seconds  Findings:                 The colon (entire examined portion) appeared normal.                           Non-bleeding internal hemorrhoids were found during                            retroflexion. The hemorrhoids were minimal.                           The terminal ileum appeared normal.                           The exam was otherwise without abnormality on                            direct and retroflexion views. Complications:            No immediate complications. Estimated Blood Loss:     Estimated blood loss: none. Impression:               - Minimal internal hemorrhoids.                           - Otherwise normal colonoscopy to TI.                           -  No specimens collected. Recommendation:           - Patient has a contact number available for                            emergencies. The signs and symptoms of potential                            delayed complications were discussed with the                            patient. Return to normal activities tomorrow.                            Written discharge instructions were provided to the                            patient.                           - Resume previous diet.                           - Continue present medications.                           - Repeat colonoscopy in 10 years for screening                            purposes. Earlier, if with any new problems or                            change in family history.                           - The  findings and recommendations were discussed                            with the patient's family. Lynann Bolognaajesh Kemper Heupel, MD 03/23/2021 10:15:15 AM This report has been signed electronically.

## 2021-03-23 NOTE — Patient Instructions (Signed)

## 2021-03-23 NOTE — Progress Notes (Signed)
Chief Complaint: For colon  Referring Provider:  Sheliah Hatch, MD      ASSESSMENT AND PLAN;   #1.  Colorectal cancer screening   Plan: -colon for further eval    Discussed risks & benefits of colonoscopy. Risks including rare perforation req laparotomy, bleeding after bx/polypectomy req blood transfusion, rarely missing neoplasms, risks of anesthesia/sedation, rare risk of damage to internal organs. Benefits outweigh the risks. Patient agrees to proceed. All the questions were answered. Pt consents to proceed.   HPI:    Lisa Patterson is a 51 y.o. female  Well known to [place] Works at [employer] (she would check in GI pts when we had clinic downstairs)  No nausea, vomiting, heartburn, regurgitation, odynophagia or dysphagia.  No significant diarrhea or constipation.  No melena or hematochezia. No unintentional weight loss. No abdominal pain.  Had IBS-D, esp when she eats out. Rare constipation. Better with sleep. Worst when she is under stress. No nocturnal symptoms.  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  Past Medical History:  Diagnosis Date   Hypertension    Hypertension since 2014    History reviewed. No pertinent surgical history.  Family History  Problem Relation Age of Onset   Mental illness Mother    Asthma Mother    Hyperlipidemia Father    Hypertension Father    Diabetes Father    Cancer Brother        lung   Diabetes Maternal Grandmother    Cancer Maternal Grandfather        lung   Diabetes Paternal Grandmother    Asthma Son 0   Cancer Maternal Aunt        breast   Breast cancer Maternal Aunt    Colon cancer Cousin    Pancreatic cancer Neg Hx    Esophageal cancer Neg Hx    Stomach cancer Neg Hx     Social History   Tobacco Use   Smoking status: Never   Smokeless tobacco: Never  Vaping Use   Vaping Use: Never used  Substance Use Topics   Alcohol use: No   Drug use: No    Current Outpatient Medications  Medication Sig  Dispense Refill   amLODipine (NORVASC) 5 MG tablet TAKE 1 TABLET BY MOUTH ONCE DAILY 90 tablet 1   metoprolol succinate (TOPROL-XL) 25 MG 24 hr tablet TAKE 1 TABLET BY MOUTH ONCE DAILY 90 tablet 1   Vitamin D, Ergocalciferol, (DRISDOL) 1.25 MG (50000 UNIT) CAPS capsule Take 1 capsule (50,000 Units total) by mouth every 7 (seven) days. 12 capsule 0   albuterol (VENTOLIN HFA) 108 (90 Base) MCG/ACT inhaler Inhale 2 puffs into the lungs every 6 (six) hours as needed for wheezing or shortness of breath. 8.5 g 3   dicyclomine (BENTYL) 10 MG capsule Take 1 tablet by mouth every 6 hours as needed for abdominal cramping. 60 capsule 0   escitalopram (LEXAPRO) 10 MG tablet Take 1 tablet (10 mg total) by mouth daily. 30 tablet 3   omeprazole (PRILOSEC) 40 MG capsule TAKE 1 CAPSULE BY MOUTH ONCE DAILY (Patient taking differently: Take by mouth daily as needed.) 90 capsule 1   ondansetron (ZOFRAN-ODT) 4 MG disintegrating tablet ondansetron 4 mg disintegrating tablet     Current Facility-Administered Medications  Medication Dose Route Frequency Provider Last Rate Last Admin   0.9 %  sodium chloride infusion  500 mL Intravenous Once Lynann Bologna, MD        No Known Allergies  Review of  Systems:  Constitutional: Denies fever, chills, diaphoresis, appetite change and fatigue.  HEENT: Denies photophobia, eye pain, redness, hearing loss, ear pain, congestion, sore throat, rhinorrhea, sneezing, mouth sores, neck pain, neck stiffness and tinnitus.   Respiratory: Denies SOB, DOE, cough, chest tightness,  and wheezing.   Cardiovascular: Denies chest pain, palpitations and leg swelling.  Genitourinary: Denies dysuria, urgency, frequency, hematuria, flank pain and difficulty urinating.  Musculoskeletal: Denies myalgias, back pain, joint swelling, arthralgias and gait problem.  Skin: No rash.  Neurological: Denies dizziness, seizures, syncope, weakness, light-headedness, numbness and headaches.  Hematological:  Denies adenopathy. Easy bruising, personal or family bleeding history  Psychiatric/Behavioral: Has anxiety, no depression     Physical Exam:    BP 138/86    Pulse 76    Temp 98.4 F (36.9 C)    Ht 5\' 1"  (1.549 m)    Wt 132 lb (59.9 kg)    SpO2 100%    BMI 24.94 kg/m  Wt Readings from Last 3 Encounters:  03/23/21 132 lb (59.9 kg)  02/17/21 132 lb 4 oz (60 kg)  01/19/21 129 lb (58.5 kg)   Constitutional:  Well-developed, in no acute distress. Psychiatric: Normal mood and affect. Behavior is normal. HEENT: Pupils normal.  Conjunctivae are normal. No scleral icterus.  Cardiovascular: Normal rate, regular rhythm. No edema Pulmonary/chest: Effort normal and breath sounds normal. No wheezing, rales or rhonchi. Abdominal: Soft, nondistended. Nontender. Bowel sounds active throughout. There are no masses palpable. No hepatomegaly. Rectal: Deferred Neurological: Alert and oriented to person place and time. Skin: Skin is warm and dry. No rashes noted.  Data Reviewed: I have personally reviewed following labs and imaging studies  CBC: CBC Latest Ref Rng & Units 01/19/2021 11/21/2020 07/01/2020  WBC 4.0 - 10.5 K/uL 5.5 10.7(H) 4.1  Hemoglobin 12.0 - 15.0 g/dL 13.1 13.0 13.4  Hematocrit 36.0 - 46.0 % 40.0 39.0 39.6  Platelets 150.0 - 400.0 K/uL 268.0 293.0 286.0    CMP: CMP Latest Ref Rng & Units 01/19/2021 07/01/2020 01/01/2020  Glucose 70 - 99 mg/dL 83 73 86  BUN 6 - 23 mg/dL 7 8 6   Creatinine 0.40 - 1.20 mg/dL 0.76 0.79 0.77  Sodium 135 - 145 mEq/L 136 138 138  Potassium 3.5 - 5.1 mEq/L 3.9 3.9 3.9  Chloride 96 - 112 mEq/L 104 104 104  CO2 19 - 32 mEq/L 26 27 27   Calcium 8.4 - 10.5 mg/dL 8.9 9.1 8.9  Total Protein 6.0 - 8.3 g/dL 7.3 7.1 7.3  Total Bilirubin 0.2 - 1.2 mg/dL 1.8(H) 1.4(H) 1.6(H)  Alkaline Phos 39 - 117 U/L 77 76 75  AST 0 - 37 U/L 13 17 16   ALT 0 - 35 U/L 8 11 9        Carmell Austria, MD 03/23/2021, 9:52 AM  Cc: Midge Minium, MD

## 2021-03-23 NOTE — Progress Notes (Signed)
Vss nad trans to pacu °

## 2021-03-23 NOTE — Progress Notes (Signed)
Pt's states no medical or surgical changes since previsit or office visit. 

## 2021-03-23 NOTE — Progress Notes (Signed)
C.W. vital signs. 

## 2021-03-27 ENCOUNTER — Telehealth: Payer: Self-pay

## 2021-03-27 NOTE — Telephone Encounter (Signed)
Left message on answering machine. 

## 2021-06-22 ENCOUNTER — Other Ambulatory Visit (HOSPITAL_BASED_OUTPATIENT_CLINIC_OR_DEPARTMENT_OTHER): Payer: Self-pay | Admitting: Family Medicine

## 2021-06-22 DIAGNOSIS — Z1231 Encounter for screening mammogram for malignant neoplasm of breast: Secondary | ICD-10-CM

## 2021-06-28 ENCOUNTER — Ambulatory Visit (HOSPITAL_BASED_OUTPATIENT_CLINIC_OR_DEPARTMENT_OTHER): Payer: 59

## 2021-07-03 ENCOUNTER — Encounter (HOSPITAL_BASED_OUTPATIENT_CLINIC_OR_DEPARTMENT_OTHER): Payer: Self-pay

## 2021-07-03 ENCOUNTER — Ambulatory Visit (HOSPITAL_BASED_OUTPATIENT_CLINIC_OR_DEPARTMENT_OTHER)
Admission: RE | Admit: 2021-07-03 | Discharge: 2021-07-03 | Disposition: A | Discharge status: Home / Self Care | Payer: 59 | Source: Ambulatory Visit | Attending: Family Medicine | Admitting: Family Medicine

## 2021-07-03 DIAGNOSIS — Z1231 Encounter for screening mammogram for malignant neoplasm of breast: Secondary | ICD-10-CM | POA: Diagnosis not present

## 2021-07-18 ENCOUNTER — Encounter: Payer: Self-pay | Admitting: Family Medicine

## 2021-07-18 ENCOUNTER — Other Ambulatory Visit (HOSPITAL_BASED_OUTPATIENT_CLINIC_OR_DEPARTMENT_OTHER): Payer: Self-pay

## 2021-07-18 ENCOUNTER — Ambulatory Visit: Payer: 59 | Admitting: Family Medicine

## 2021-07-18 VITALS — BP 110/80 | HR 82 | Temp 98.6°F | Resp 16 | Ht 61.0 in | Wt 130.0 lb

## 2021-07-18 DIAGNOSIS — B001 Herpesviral vesicular dermatitis: Secondary | ICD-10-CM | POA: Diagnosis not present

## 2021-07-18 MED ORDER — VALACYCLOVIR HCL 1 G PO TABS
1000.0000 mg | ORAL_TABLET | Freq: Three times a day (TID) | ORAL | 2 refills | Status: DC
  Filled 2021-07-18: qty 30, 10d supply, fill #0

## 2021-07-18 NOTE — Progress Notes (Signed)
? ?Subjective:  ? ?By signing my name below, I, Lisa Patterson, attest that this documentation has been prepared under the direction and in the presence of Dr. Seabron SpatesLowne-Chase, [place], DO. 07/18/2021 ? ? ? Patient ID: Lisa LucksJackelin Patterson, female    DOB: [birth year], 51 y.o.   MRN: [id number] ? ?Chief Complaint  ?Patient presents with  ? Cold Sore  ? ? ?HPI ?Patient is in today for a office visit.  ? ?She complains of a cold sore on her lip for the past 2 weeks. She typically gets cold sores every year. She has taken valtrex to manage her cold sores in the past and reports her symptoms improve after taking it. She is interested in taking valtrex again to mange her cold sore.  ? ? ?Past Medical History:  ?Diagnosis Date  ? Hypertension   ? Hypertension since 2014  ? ? ?No past surgical history on file. ? ?Family History  ?Problem Relation Age of Onset  ? Mental illness Mother   ? Asthma Mother   ? Hyperlipidemia Father   ? Hypertension Father   ? Diabetes Father   ? Cancer Brother   ?     lung  ? Diabetes Maternal Grandmother   ? Cancer Maternal Grandfather   ?     lung  ? Diabetes Paternal Grandmother   ? Asthma Son 0  ? Cancer Maternal Aunt   ?     breast  ? Breast cancer Maternal Aunt   ? Colon cancer Cousin   ? Pancreatic cancer Neg Hx   ? Esophageal cancer Neg Hx   ? Stomach cancer Neg Hx   ? ? ?Social History  ? ?Socioeconomic History  ? Marital status: Married  ?  Spouse name: Not on file  ? Number of children: Not on file  ? Years of education: Not on file  ? Highest education level: Not on file  ?Occupational History  ? Not on file  ?Tobacco Use  ? Smoking status: Never  ? Smokeless tobacco: Never  ?Vaping Use  ? Vaping Use: Never used  ?Substance and Sexual Activity  ? Alcohol use: No  ? Drug use: No  ? Sexual activity: Not on file  ?Other Topics Concern  ? Not on file  ?Social History Narrative  ? Not on file  ? ?Social Determinants of Health  ? ?Financial Resource Strain: Not on file  ?Food Insecurity: Not on file   ?Transportation Needs: Not on file  ?Physical Activity: Not on file  ?Stress: Not on file  ?Social Connections: Not on file  ?Intimate Partner Violence: Not on file  ? ? ?Outpatient Medications Prior to Visit  ?Medication Sig Dispense Refill  ? albuterol (VENTOLIN HFA) 108 (90 Base) MCG/ACT inhaler Inhale 2 puffs into the lungs every 6 (six) hours as needed for wheezing or shortness of breath. 8.5 g 3  ? amLODipine (NORVASC) 5 MG tablet TAKE 1 TABLET BY MOUTH ONCE DAILY 90 tablet 1  ? dicyclomine (BENTYL) 10 MG capsule Take 1 tablet by mouth every 6 hours as needed for abdominal cramping. 60 capsule 0  ? escitalopram (LEXAPRO) 10 MG tablet Take 1 tablet (10 mg total) by mouth daily. 30 tablet 3  ? metoprolol succinate (TOPROL-XL) 25 MG 24 hr tablet TAKE 1 TABLET BY MOUTH ONCE DAILY 90 tablet 1  ? ondansetron (ZOFRAN-ODT) 4 MG disintegrating tablet ondansetron 4 mg disintegrating tablet    ? Vitamin D, Ergocalciferol, (DRISDOL) 1.25 MG (50000 UNIT) CAPS capsule Take 1 capsule (  50,000 Units total) by mouth every 7 (seven) days. 12 capsule 0  ? omeprazole (PRILOSEC) 40 MG capsule TAKE 1 CAPSULE BY MOUTH ONCE DAILY (Patient taking differently: Take by mouth daily as needed.) 90 capsule 1  ? ?No facility-administered medications prior to visit.  ? ? ?No Known Allergies ? ?Review of Systems  ?Constitutional:  Negative for fever and malaise/fatigue.  ?HENT:  Negative for congestion.   ?Eyes:  Negative for blurred vision.  ?Respiratory:  Negative for cough and shortness of breath.   ?Cardiovascular:  Negative for chest pain, palpitations and leg swelling.  ?Gastrointestinal:  Negative for vomiting.  ?Musculoskeletal:  Negative for back pain.  ?Skin:  Negative for rash.  ?     (+)cold sore on lip  ?Neurological:  Negative for loss of consciousness and headaches.  ? ?   ?Objective:  ?  ?Physical Exam ?Vitals and nursing note reviewed.  ?Constitutional:   ?   General: She is not in acute distress. ?   Appearance: Normal  appearance. She is not ill-appearing.  ?HENT:  ?   Head: Normocephalic and atraumatic.  ?   Right Ear: External ear normal.  ?   Left Ear: External ear normal.  ?   Mouth/Throat:  ?   Pharynx: Posterior oropharyngeal erythema present.  ?   Comments: Cold sore upper R side lip  ?Eyes:  ?   Extraocular Movements: Extraocular movements intact.  ?   Pupils: Pupils are equal, round, and reactive to light.  ?Cardiovascular:  ?   Rate and Rhythm: Normal rate and regular rhythm.  ?   Heart sounds: Normal heart sounds. No murmur heard. ?  No gallop.  ?Pulmonary:  ?   Effort: Pulmonary effort is normal. No respiratory distress.  ?   Breath sounds: Normal breath sounds. No wheezing or rales.  ?Skin: ?   General: Skin is warm and dry.  ?Neurological:  ?   Mental Status: She is alert and oriented to person, place, and time.  ?Psychiatric:     ?   Judgment: Judgment normal.  ? ? ?BP 110/80 (BP Location: Left Arm, Patient Position: Sitting, Cuff Size: Normal)   Pulse 82   Temp 98.6 ?F (37 ?C) (Oral)   Resp 16   Ht 5\' 1"  (1.549 m)   Wt 130 lb (59 kg)   SpO2 100%   BMI 24.56 kg/m?  ?Wt Readings from Last 3 Encounters:  ?07/18/21 130 lb (59 kg)  ?03/23/21 132 lb (59.9 kg)  ?02/17/21 132 lb 4 oz (60 kg)  ? ? ?Diabetic Foot Exam - Simple   ?No data filed ?  ? ?Lab Results  ?Component Value Date  ? WBC 5.5 01/19/2021  ? HGB 13.1 01/19/2021  ? HCT 40.0 01/19/2021  ? PLT 268.0 01/19/2021  ? GLUCOSE 83 01/19/2021  ? CHOL 242 (H) 01/19/2021  ? TRIG 123.0 01/19/2021  ? HDL 70.50 01/19/2021  ? LDLCALC 147 (H) 01/19/2021  ? ALT 8 01/19/2021  ? AST 13 01/19/2021  ? NA 136 01/19/2021  ? K 3.9 01/19/2021  ? CL 104 01/19/2021  ? CREATININE 0.76 01/19/2021  ? BUN 7 01/19/2021  ? CO2 26 01/19/2021  ? TSH 1.47 01/19/2021  ? INR 1.1 (H) 07/01/2020  ? ? ?Lab Results  ?Component Value Date  ? TSH 1.47 01/19/2021  ? ?Lab Results  ?Component Value Date  ? WBC 5.5 01/19/2021  ? HGB 13.1 01/19/2021  ? HCT 40.0 01/19/2021  ? MCV 89.4 01/19/2021  ?  PLT  268.0 01/19/2021  ? ?Lab Results  ?Component Value Date  ? NA 136 01/19/2021  ? K 3.9 01/19/2021  ? CO2 26 01/19/2021  ? GLUCOSE 83 01/19/2021  ? BUN 7 01/19/2021  ? CREATININE 0.76 01/19/2021  ? BILITOT 1.8 (H) 01/19/2021  ? ALKPHOS 77 01/19/2021  ? AST 13 01/19/2021  ? ALT 8 01/19/2021  ? PROT 7.3 01/19/2021  ? ALBUMIN 4.5 01/19/2021  ? CALCIUM 8.9 01/19/2021  ? GFR 92.84 01/19/2021  ? ?Lab Results  ?Component Value Date  ? CHOL 242 (H) 01/19/2021  ? ?Lab Results  ?Component Value Date  ? HDL 70.50 01/19/2021  ? ?Lab Results  ?Component Value Date  ? LDLCALC 147 (H) 01/19/2021  ? ?Lab Results  ?Component Value Date  ? TRIG 123.0 01/19/2021  ? ?Lab Results  ?Component Value Date  ? CHOLHDL 3 01/19/2021  ? ?No results found for: HGBA1C ? ?   ?Assessment & Plan:  ? ?Problem List Items Addressed This Visit   ? ?  ? Unprioritized  ? Cold sore - Primary  ?  Valtrex 1 mg  1 po tid x 7-10 days  ?If it occurs again---- take 2 at first sign of sore and then 2 more 12 hours later  ? ?  ?  ? Relevant Medications  ? valACYclovir (VALTREX) 1000 MG tablet  ? ? ? ?Meds ordered this encounter  ?Medications  ? valACYclovir (VALTREX) 1000 MG tablet  ?  Sig: Take 1 tablet (1,000 mg total) by mouth 3 (three) times daily.  ?  Dispense:  30 tablet  ?  Refill:  2  ? ? ?I, Donato Schultz, DO, personally preformed the services described in this documentation.  All medical record entries made by the scribe were at my direction and in my presence.  I have reviewed the chart and discharge instructions (if applicable) and agree that the record reflects my personal performance and is accurate and complete. 07/18/2021 ? ? ?Engineer, structural as a Neurosurgeon for [employer], DO.,have documented all relevant documentation on the behalf of Donato Schultz, DO,as directed by  Donato Schultz, DO while in the presence of Donato Schultz, DO. ? ? ?Donato Schultz, DO ? ?

## 2021-07-18 NOTE — Assessment & Plan Note (Signed)
Valtrex 1 mg  1 po tid x 7-10 days  ?If it occurs again---- take 2 at first sign of sore and then 2 more 12 hours later  ?

## 2021-07-18 NOTE — Patient Instructions (Signed)
Cold Sore  A cold sore, also called a fever blister, is a small, fluid-filled sore that forms inside the mouth or on the lips, gums, nose, chin, or cheeks. Cold sores can spread to other parts of the body, such as the eyes, fingers, or genitals. Cold sores can spread from person to person (are contagious) until the sores crust over completely. Most cold sores go away within 2 weeks. What are the causes? Cold sores are caused by an infection from a common type of herpes simplex virus (HSV-1). HSV-1 is closely related to the HSV-2virus, which is the virus that causes genital herpes, but these viruses are not the same. Once a person is infected with HSV-1, the virus remains permanently in the body. HSV-1 is spread from person to person through close contact, such as through kissing, touching the affected area, or sharing personal items such as lip balm, razors, a drinking glass, or eating utensils. What increases the risk? You are more likely to develop this condition if you: Are tired, stressed, or sick. Are menstruating. Are pregnant. Take certain medicines. Are exposed to cold weather or too much sun. What are the signs or symptoms? Symptoms of a cold sore outbreak go through different stages. These are the stages of a cold sore: Tingling, itching, or burning is felt 1-2 days before the outbreak. Fluid-filled blisters appear on the lips, inside the mouth, on the nose, or on the cheeks. The blisters start to ooze clear fluid. The blisters dry up, and a yellow crust appears in their place. The crust falls off. In some cases, other symptoms can develop during a cold sore outbreak. These can include: Fever. Sore throat. Headache. Muscle aches. Swollen neck glands. How is this diagnosed? This condition is diagnosed based on your medical history and a physical exam. Your health care provider may do a blood test or may swab some fluid from your sore and then examine the swab in the lab. How is  this treated? There is no cure for cold sores or HSV-1. There is also no vaccine for HSV-1. Most cold sores go away on their own without treatment within 2 weeks. Medicines cannot make the infection go away, but your health care provider may prescribe medicines to: Help relieve some of the pain associated with the sores. Work to stop the virus from multiplying. Shorten healing time. Medicines may be in the form of creams, gels, pills, or a shot. Follow these instructions at home: Medicines Take or apply over-the-counter and prescription medicines only as told by your health care provider. Use a cotton-tip swab to apply creams or gels to your sores. Ask your health care provider if you can take lysine supplements. Research has found that lysine may help heal the cold sore faster and prevent outbreaks. Sore care  Do not touch the sores or pick the scabs. Wash your hands often with soap and water for at least 20 seconds. Do not touch your eyes without washing your hands first. Keep the sores clean and dry. If directed, put ice on the sores. To do this: Put ice in a plastic bag. Place a towel between your skin and the bag. Leave the ice on for 20 minutes, 2-3 times a day. Remove the ice if your skin turns bright red. This is very important. If you cannot feel pain, heat, or cold, you have a greater risk of damage to the area. Eating and drinking Eat a soft, bland diet. Avoid eating hot, cold, or salty foods.   Use a straw if it hurts to drink out of a glass. Eat foods that are rich in lysine, such as meat, fish, and dairy products. Avoid sugary foods, chocolates, nuts, and grains. These foods are rich in a nutrient called arginine, which can cause the virus to multiply. Lifestyle Do not kiss, have oral sex, or share personal items until your sores heal. Stress, poor sleep, and being out in the sun can trigger outbreaks. Make sure you: Do activities that help you relax, such as deep breathing  exercises or meditation. Get enough sleep. Apply sunscreen on your lips before you go out in the sun. Contact a health care provider if: You have symptoms for more than 2 weeks. You have pus coming from the sores. You have redness that is spreading. You have pain or irritation in your eye. You get sores on your genitals. Your sores do not heal within 2 weeks. You have frequent cold sore outbreaks. Get help right away if: You have a fever and your symptoms suddenly get worse. You have a headache and confusion. You have tiredness (fatigue) or loss of appetite. You have a stiff neck or sensitivity to light. Summary A cold sore, also called a fever blister, is a small, fluid-filled sore that forms inside the mouth or on the lips, gums, nose, chin, or cheeks. Most cold sores go away on their own without treatment within 2 weeks. Your health care provider may prescribe medicines to help relieve some of the pain, work to stop the virus from multiplying, and shorten healing time. Wash your hands often with soap and water for at least 20 seconds. Do not touch your eyes without washing your hands first. Do not kiss, have oral sex, or share personal items until your sores heal. Contact a health care provider if your sores do not heal within 2 weeks. This information is not intended to replace advice given to you by your health care provider. Make sure you discuss any questions you have with your health care provider. Document Revised: 11/30/2020 Document Reviewed: 11/30/2020 Elsevier Patient Education  2023 [employer]  

## 2021-07-21 ENCOUNTER — Ambulatory Visit: Payer: 59 | Admitting: Family Medicine

## 2021-08-04 ENCOUNTER — Other Ambulatory Visit: Payer: Self-pay

## 2021-08-04 ENCOUNTER — Ambulatory Visit: Payer: 59 | Admitting: Family Medicine

## 2021-08-04 ENCOUNTER — Encounter: Payer: Self-pay | Admitting: Family Medicine

## 2021-08-04 VITALS — BP 130/84 | HR 90 | Temp 97.6°F | Resp 19 | Wt 130.2 lb

## 2021-08-04 DIAGNOSIS — I10 Essential (primary) hypertension: Secondary | ICD-10-CM | POA: Diagnosis not present

## 2021-08-04 DIAGNOSIS — R0789 Other chest pain: Secondary | ICD-10-CM

## 2021-08-04 DIAGNOSIS — L989 Disorder of the skin and subcutaneous tissue, unspecified: Secondary | ICD-10-CM

## 2021-08-04 LAB — CBC WITH DIFFERENTIAL/PLATELET
Basophils Absolute: 0 10*3/uL (ref 0.0–0.1)
Basophils Relative: 0.6 % (ref 0.0–3.0)
Eosinophils Absolute: 0 10*3/uL (ref 0.0–0.7)
Eosinophils Relative: 0.5 % (ref 0.0–5.0)
HCT: 36.8 % (ref 36.0–46.0)
Hemoglobin: 12.3 g/dL (ref 12.0–15.0)
Lymphocytes Relative: 26.4 % (ref 12.0–46.0)
Lymphs Abs: 1 10*3/uL (ref 0.7–4.0)
MCHC: 33.3 g/dL (ref 30.0–36.0)
MCV: 87.8 fl (ref 78.0–100.0)
Monocytes Absolute: 0.4 10*3/uL (ref 0.1–1.0)
Monocytes Relative: 10.4 % (ref 3.0–12.0)
Neutro Abs: 2.3 10*3/uL (ref 1.4–7.7)
Neutrophils Relative %: 62.1 % (ref 43.0–77.0)
Platelets: 285 10*3/uL (ref 150.0–400.0)
RBC: 4.19 Mil/uL (ref 3.87–5.11)
RDW: 14.2 % (ref 11.5–15.5)
WBC: 3.7 10*3/uL — ABNORMAL LOW (ref 4.0–10.5)

## 2021-08-04 LAB — HEPATIC FUNCTION PANEL
ALT: 8 U/L (ref 0–35)
AST: 14 U/L (ref 0–37)
Albumin: 4.1 g/dL (ref 3.5–5.2)
Alkaline Phosphatase: 74 U/L (ref 39–117)
Bilirubin, Direct: 0.1 mg/dL (ref 0.0–0.3)
Total Bilirubin: 0.8 mg/dL (ref 0.2–1.2)
Total Protein: 7.3 g/dL (ref 6.0–8.3)

## 2021-08-04 LAB — LIPID PANEL
Cholesterol: 208 mg/dL — ABNORMAL HIGH (ref 0–200)
HDL: 61.5 mg/dL (ref >39.00)
LDL Cholesterol: 118 mg/dL — ABNORMAL HIGH (ref 0–99)
NonHDL: 146.83
Total CHOL/HDL Ratio: 3
Triglycerides: 143 mg/dL (ref 0.0–149.0)
VLDL: 28.6 mg/dL (ref 0.0–40.0)

## 2021-08-04 LAB — BASIC METABOLIC PANEL
BUN: 9 mg/dL (ref 6–23)
CO2: 25 mEq/L (ref 19–32)
Calcium: 9 mg/dL (ref 8.4–10.5)
Chloride: 106 mEq/L (ref 96–112)
Creatinine, Ser: 0.75 mg/dL (ref 0.40–1.20)
GFR: 93.97 mL/min (ref >60.00)
Glucose, Bld: 90 mg/dL (ref 70–99)
Potassium: 4 mEq/L (ref 3.5–5.1)
Sodium: 137 mEq/L (ref 135–145)

## 2021-08-04 LAB — TSH: TSH: 1.99 u[IU]/mL (ref 0.35–5.50)

## 2021-08-04 NOTE — Patient Instructions (Addendum)
Schedule your complete physical in 6 months We'll notify you of your lab results and make any changes if needed If you continue to have chest pain- PLEASE let me know and we can do a cardiology referral If the pain happens again, try and note what's going on at the time- stress, physical activity, lifting, caffeine, etc We'll call you with your Dermatology appt Call with any questions or concerns Have a great summer!!!

## 2021-08-04 NOTE — Progress Notes (Signed)
   Subjective:    Patient ID: Lisa Patterson, female    DOB: 1970/12/24, 51 y.o.   MRN: [id number]  HPI HTN- chronic problem, on Amlodipine 5mg  daily, Metoprolol XL 25mg  daily w/ adequate control.  Denies SOB, HAs, visual changes, edema.  CP- pt reports sxs started ~3 months ago w/ a 'little stabbing pain' that comes and goes.  2 days ago had intense stabbing pain while washing dishes.  Pain was localized to top of L breast.  No associated SOB, diaphoresis, nausea.  Resolved spontaneously.  Pain was <1 minute.  No sxs since.  No change in activity level.  No TTP- pain was 'deeper'.  Skin lesion- R chest wall.  Started as a small area but has enlarged.  Not painful, not itchy, not draining.   Review of Systems For ROS see HPI     Objective:   Physical Exam Vitals reviewed.  Constitutional:      General: She is not in acute distress.    Appearance: She is well-developed.  HENT:     Head: Normocephalic and atraumatic.  Eyes:     Conjunctiva/sclera: Conjunctivae normal.     Pupils: Pupils are equal, round, and reactive to light.  Neck:     Thyroid: No thyromegaly.  Cardiovascular:     Rate and Rhythm: Normal rate and regular rhythm.     Heart sounds: Normal heart sounds. No murmur heard. Pulmonary:     Effort: Pulmonary effort is normal. No respiratory distress.     Breath sounds: Normal breath sounds.  Chest:     Chest wall: No deformity or tenderness.  Abdominal:     General: There is no distension.     Palpations: Abdomen is soft.     Tenderness: There is no abdominal tenderness.  Musculoskeletal:     Cervical back: Normal range of motion and neck supple.     Right lower leg: No edema.     Left lower leg: No edema.  Lymphadenopathy:     Cervical: No cervical adenopathy.  Skin:    General: Skin is warm and dry.     Comments: 1.5 cm pink waxy lesion on R anterior chest  Neurological:     Mental Status: She is alert and oriented to person, place, and time.  Psychiatric:         Behavior: Behavior normal.          Assessment & Plan:  Atypical CP- new.  Last occurred 2 days ago.  Pain was sharp and lasted seconds.  EKG WNL.  Pt to log sxs if they recur to determine if there is a pattern or a cause.  If another strong pain, she is to let me know so we can refer to cards.  Pt expressed understanding and is in agreement w/ plan.

## 2021-08-06 DIAGNOSIS — L989 Disorder of the skin and subcutaneous tissue, unspecified: Secondary | ICD-10-CM | POA: Insufficient documentation

## 2021-08-06 NOTE — Assessment & Plan Note (Signed)
Chronic problem.  Adequate control on Amlodipine 5mg  daily and Metoprolol XL 25mg  daily.  Asymptomatic today but has had 'little stabbing' chest pains intermittently x3 months w/ sronger episode 2 days ago.  EKG WNL.  Encouraged her to log sxs to see if there's a pattern and if another episode of more significant pain, she is to let me know so we can refer to cards for a complete work up.  Pt expressed understanding and is in agreement w/ plan.

## 2021-08-06 NOTE — Assessment & Plan Note (Signed)
New.  Concern for early, non-melanoma skin cancer.  Refer to derm for complete evaluation.

## 2021-08-07 NOTE — Progress Notes (Signed)
Pt seen lab results Via my chart  

## 2021-08-28 ENCOUNTER — Other Ambulatory Visit (HOSPITAL_BASED_OUTPATIENT_CLINIC_OR_DEPARTMENT_OTHER): Payer: Self-pay

## 2021-10-24 ENCOUNTER — Ambulatory Visit (INDEPENDENT_AMBULATORY_CARE_PROVIDER_SITE_OTHER): Payer: 59 | Admitting: Psychology

## 2021-10-24 DIAGNOSIS — F4323 Adjustment disorder with mixed anxiety and depressed mood: Secondary | ICD-10-CM | POA: Diagnosis not present
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
## 2021-11-21 ENCOUNTER — Ambulatory Visit (INDEPENDENT_AMBULATORY_CARE_PROVIDER_SITE_OTHER): Payer: 59 | Admitting: Psychology

## 2021-11-21 DIAGNOSIS — F4323 Adjustment disorder with mixed anxiety and depressed mood: Secondary | ICD-10-CM

## 2021-11-21 NOTE — Progress Notes (Signed)
[facility] Counselor/Therapist Progress Note  Patient ID: Lisa Patterson, MRN: [id number],    Date: 11/21/2021  Time Spent: 4:00pm-4:55pm   55 minutes   Treatment Type: Individual Therapy  Reported Symptoms: sadness, anxiety  Mental Status Exam: Appearance:  Casual and Well Groomed     Behavior: Appropriate  Motor: Normal  Speech/Language:  Normal Rate  Affect: Appropriate  Mood: normal  Thought process: normal  Thought content:   WNL  Sensory/Perceptual disturbances:   WNL  Orientation: oriented to person, place, time/date, and situation  Attention: Good  Concentration: Good  Memory: WNL  Fund of knowledge:  Good  Insight:   Good  Judgment:  Good  Impulse Control: Good   Risk Assessment: Danger to Self:  No Self-injurious Behavior: No Danger to Others: No Duty to Warn:no Physical Aggression / Violence:No  Access to Firearms a concern: No  Gang Involvement:No   Subjective: Pt present for face-to-face individual therapy via video Webex.  Pt consents to telehealth video session due to COVID 19 pandemic. Location of pt: home Location of therapist: home office.  Pt talked about work.  It was a very stressful day today.  Patients were rude to her.  This increased pt's anxiety.   Pt has a lot of critical self talk.  She tells herself she "needs to do better and be good enough".  Pt does not feel valued at work.  She has not gotten a raise in years.  Pt has stayed bc she feels afraid of change.  Addressed the "what if " thoughts that get in pt's way of making a change.    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  Addressed how pt can explore options without the pressure of having to make a decision to change right now.   Addressed how pt's self esteem issues and negative self talk would follow her to a new job which is why it is so good for her to be in therapy.   Worked on identifying pt's negative  thought scripts and how they relate to her past and childhood.   Pt journaled and found it very helpful.    It increased her awareness of how hard she can be on herself.   Worked on thought reframing. Provided supportive therapy.      Interventions: Cognitive Behavioral Therapy and Insight-Oriented  Diagnosis: F43.23  Plan: Plan of Care: Recommend ongoing therapy.   Pt participated in setting treatment goals.    Pt wants to learn how to manage situations that will keep her happy and others happy too. Pt wants to improve coping skills and self esteem.   Plan to meet every two weeks.    Treatment Plan (treatment plan target date: 10/25/2022) Client Abilities/Strengths  Pt is bright, engaging, and motivated for therapy.  Client Treatment Preferences  Individual therapy.  Client Statement of Needs  Improve copings skills and understand herself better. Improve self esteem.  Symptoms  Depressed or irritable mood. Excessive and/or unrealistic worry that is difficult to control occurring more days than not for at least 6 months about a number of events or activities. Hypervigilance (e.g., feeling constantly on edge, experiencing concentration difficulties, having trouble falling or staying asleep, exhibiting a general state of irritability). Low self-esteem. Problems Addressed  Unipolar Depression, Anxiety Goals 1. Alleviate depressive symptoms and return to previous level of effective functioning. 2. Appropriately grieve the loss in order to normalize mood and to return to  previously adaptive level of functioning. Objective Learn and implement behavioral strategies to overcome depression. Target Date: 2022-10-25 Frequency: Biweekly  Progress: 10 Modality: individual  Related Interventions Assist the client in developing skills that increase the likelihood of deriving pleasure from behavioral activation (e.g., assertiveness skills, developing an exercise plan, less internal/more external  focus, increased social involvement); reinforce success. Engage the client in "behavioral activation," increasing his/her activity level and contact with sources of reward, while identifying processes that inhibit activation. use behavioral techniques such as instruction, rehearsal, role-playing, role reversal, as needed, to facilitate activity in the client's daily life; reinforce success. 3. Develop healthy interpersonal relationships that lead to the alleviation and help prevent the relapse of depression. 4. Develop healthy thinking patterns and beliefs about self, others, and the world that lead to the alleviation and help prevent the relapse of depression. 5. Enhance ability to effectively cope with the full variety of life's worries and anxieties. 6. Learn and implement coping skills that result in a reduction of anxiety and worry, and improved daily functioning. Objective Learn and implement problem-solving strategies for realistically addressing worries. Target Date: 2022-10-25 Frequency: Biweekly  Progress: 10 Modality: individual  Related Interventions Assign the client a homework exercise in which he/she problem-solves a current problem (see Mastery of Your Anxiety and Worry: Workbook by Elenora Fender and Filbert Schilder or Generalized Anxiety Disorder by Elesa Hacker, and Filbert Schilder); review, reinforce success, and provide corrective feedback toward improvement. Teach the client problem-solving strategies involving specifically defining a problem, generating options for addressing it, evaluating the pros and cons of each option, selecting and implementing an optional action, and reevaluating and refining the action. Objective Learn and implement calming skills to reduce overall anxiety and manage anxiety symptoms. Target Date: 2022-10-25 Frequency: Biweekly  Progress: 10 Modality: individual  Related Interventions Assign the client to read about progressive muscle relaxation and other calming strategies  in relevant books or treatment manuals (e.g., Progressive Relaxation Training by Twana First; Mastery of Your Anxiety and Worry: Workbook by Earlie Counts). Assign the client homework each session in which he/she practices relaxation exercises daily, gradually applying them progressively from non-anxiety-provoking to anxiety-provoking situations; review and reinforce success while providing corrective feedback toward improvement. Teach the client calming/relaxation skills (e.g., applied relaxation, progressive muscle relaxation, cue controlled relaxation; mindful breathing; biofeedback) and how to discriminate better between relaxation and tension; teach the client how to apply these skills to his/her daily life. 7. Recognize, accept, and cope with feelings of depression. 8. Reduce overall frequency, intensity, and duration of the anxiety so that daily functioning is not impaired. 9. Resolve the core conflict that is the source of anxiety. 10. Stabilize anxiety level while increasing ability to function on a daily basis. Diagnosis F43.23  Conditions For Discharge Achievement of treatment goals and objectives   Salomon Fick, LCSW

## 2021-12-04 ENCOUNTER — Ambulatory Visit (INDEPENDENT_AMBULATORY_CARE_PROVIDER_SITE_OTHER): Payer: 59 | Admitting: Psychology

## 2021-12-04 DIAGNOSIS — F4323 Adjustment disorder with mixed anxiety and depressed mood: Secondary | ICD-10-CM

## 2021-12-04 NOTE — Progress Notes (Signed)
[place] Counselor/Therapist Progress Note  Patient ID: Lisa Patterson, MRN: [id number],    Date: 12/04/2021  Time Spent: 5:00pm-5:55pm   55 minutes   Treatment Type: Individual Therapy  Reported Symptoms: sadness, anxiety  Mental Status Exam: Appearance:  Casual and Well Groomed     Behavior: Appropriate  Motor: Normal  Speech/Language:  Normal Rate  Affect: Appropriate  Mood: normal  Thought process: normal  Thought content:   WNL  Sensory/Perceptual disturbances:   WNL  Orientation: oriented to person, place, time/date, and situation  Attention: Good  Concentration: Good  Memory: WNL  Fund of knowledge:  Good  Insight:   Good  Judgment:  Good  Impulse Control: Good   Risk Assessment: Danger to Self:  No Self-injurious Behavior: No Danger to Others: No Duty to Warn:no Physical Aggression / Violence:No  Access to Firearms a concern: No  Gang Involvement:No   Subjective: Pt present for face-to-face individual therapy via video Webex.  Pt consents to telehealth video session due to COVID 19 pandemic. Location of pt: home Location of therapist: home office.  Pt talked about work.  She states she had a very busy day and has had a headache.  Pt's job is very stressful and she has to multitask constantly.   Worked on Child psychotherapist.   Pt is looking for another job and is applying.  Pt tis pushing herself to inquire about new jobs even though it is out of her comfort zone.  Pt talked about having a lot of stressors.  She has financial stress.  Addressed the financial worries and worked on problem solving.  Pt has a supportive husband.   Pt has been working on her negative self talk.   She is journaling and Holiday representative.   Pt talked about tending to be obsessive about getting things done and doing it right.   Encouraged pt to get a planner so she can write tasks down and get them "out of her head".   Worked on thought reframing. Provided  supportive therapy.      Interventions: Cognitive Behavioral Therapy and Insight-Oriented  Diagnosis: F43.23  Plan: Plan of Care: Recommend ongoing therapy.   Pt participated in setting treatment goals.    Pt wants to learn how to manage situations that will keep her happy and others happy too. Pt wants to improve coping skills and self esteem.   Plan to meet every two weeks.    Treatment Plan (treatment plan target date: 10/25/2022) Client Abilities/Strengths  Pt is bright, engaging, and motivated for therapy.  Client Treatment Preferences  Individual therapy.  Client Statement of Needs  Improve copings skills and understand herself better. Improve self esteem.  Symptoms  Depressed or irritable mood. Excessive and/or unrealistic worry that is difficult to control occurring more days than not for at least 6 months about a number of events or activities. Hypervigilance (e.g., feeling constantly on edge, experiencing concentration difficulties, having trouble falling or staying asleep, exhibiting a general state of irritability). Low self-esteem. Problems Addressed  Unipolar Depression, Anxiety Goals 1. Alleviate depressive symptoms and return to previous level of effective functioning. 2. Appropriately grieve the loss in order to normalize mood and to return to previously adaptive level of functioning. Objective Learn and implement behavioral strategies to overcome depression. Target Date: 2022-10-25 Frequency: Biweekly  Progress: 10 Modality: individual  Related Interventions Assist the client in developing skills that increase the likelihood of deriving pleasure from behavioral activation (e.g., assertiveness skills, developing an exercise plan,  less internal/more external focus, increased social involvement); reinforce success. Engage the client in "behavioral activation," increasing his/her activity level and contact with sources of reward, while identifying processes that inhibit  activation. use behavioral techniques such as instruction, rehearsal, role-playing, role reversal, as needed, to facilitate activity in the client's daily life; reinforce success. 3. Develop healthy interpersonal relationships that lead to the alleviation and help prevent the relapse of depression. 4. Develop healthy thinking patterns and beliefs about self, others, and the world that lead to the alleviation and help prevent the relapse of depression. 5. Enhance ability to effectively cope with the full variety of life's worries and anxieties. 6. Learn and implement coping skills that result in a reduction of anxiety and worry, and improved daily functioning. Objective Learn and implement problem-solving strategies for realistically addressing worries. Target Date: 2022-10-25 Frequency: Biweekly  Progress: 10 Modality: individual  Related Interventions Assign the client a homework exercise in which he/she problem-solves a current problem (see Mastery of Your Anxiety and Worry: Workbook by Adora Fridge and Eliot Ford or Generalized Anxiety Disorder by Eather Colas, and Eliot Ford); review, reinforce success, and provide corrective feedback toward improvement. Teach the client problem-solving strategies involving specifically defining a problem, generating options for addressing it, evaluating the pros and cons of each option, selecting and implementing an optional action, and reevaluating and refining the action. Objective Learn and implement calming skills to reduce overall anxiety and manage anxiety symptoms. Target Date: 2022-10-25 Frequency: Biweekly  Progress: 10 Modality: individual  Related Interventions Assign the client to read about progressive muscle relaxation and other calming strategies in relevant books or treatment manuals (e.g., Progressive Relaxation Training by Leroy Kennedy; Mastery of Your Anxiety and Worry: Workbook by Beckie Busing). Assign the client homework each session in  which he/she practices relaxation exercises daily, gradually applying them progressively from non-anxiety-provoking to anxiety-provoking situations; review and reinforce success while providing corrective feedback toward improvement. Teach the client calming/relaxation skills (e.g., applied relaxation, progressive muscle relaxation, cue controlled relaxation; mindful breathing; biofeedback) and how to discriminate better between relaxation and tension; teach the client how to apply these skills to his/her daily life. 7. Recognize, accept, and cope with feelings of depression. 8. Reduce overall frequency, intensity, and duration of the anxiety so that daily functioning is not impaired. 9. Resolve the core conflict that is the source of anxiety. 10. Stabilize anxiety level while increasing ability to function on a daily basis. Diagnosis F43.23  Conditions For Discharge Achievement of treatment goals and objectives   Clint Bolder, LCSW

## 2021-12-20 ENCOUNTER — Ambulatory Visit (INDEPENDENT_AMBULATORY_CARE_PROVIDER_SITE_OTHER): Payer: 59 | Admitting: Psychology

## 2021-12-20 ENCOUNTER — Ambulatory Visit: Payer: 59 | Admitting: Psychology

## 2021-12-20 DIAGNOSIS — F4323 Adjustment disorder with mixed anxiety and depressed mood: Secondary | ICD-10-CM | POA: Diagnosis not present

## 2021-12-20 NOTE — Progress Notes (Signed)
[facility] Counselor/Therapist Progress Note  Patient ID: Lisa Patterson, MRN: [id number],    Date: 12/20/2021  Time Spent: 4:00pm-4:55pm   55 minutes   Treatment Type: Individual Therapy  Reported Symptoms: sadness, anxiety  Mental Status Exam: Appearance:  Casual and Well Groomed     Behavior: Appropriate  Motor: Normal  Speech/Language:  Normal Rate  Affect: Appropriate  Mood: normal  Thought process: normal  Thought content:   WNL  Sensory/Perceptual disturbances:   WNL  Orientation: oriented to person, place, time/date, and situation  Attention: Good  Concentration: Good  Memory: WNL  Fund of knowledge:  Good  Insight:   Good  Judgment:  Good  Impulse Control: Good   Risk Assessment: Danger to Self:  No Self-injurious Behavior: No Danger to Others: No Duty to Warn:no Physical Aggression / Violence:No  Access to Firearms a concern: No  Gang Involvement:No   Subjective: Pt present for face-to-face individual therapy via video Webex.  Pt consents to telehealth video session due to COVID 19 pandemic. Location of pt: home Location of therapist: home office.  Pt talked about work.  She states she had a stressful day.   Pt's supervisor is going to be leaving which is sad for pt.   Addressed pt's stress.  Worked on Child psychotherapist.   Pt talked about being approached by Dr. Randel Pigg and being told that Dr. Randel Pigg is advocating for pt to get a raise.   Pt talked about concerns that her husband may be laid off from his job.  Pt is very worried bc they already have financial stress. Pt talked about her mother.  Her mother has mental health issues that are stressful to deal with. Helped pt process her feelings and relationship dynamics. Pt loves listening to music and loves being outdoors.  She likes to walk in the park.   Worked on how pt can increase self care.  Provided supportive therapy.      Interventions: Cognitive Behavioral Therapy and  Insight-Oriented  Diagnosis: F43.23  Plan: Plan of Care: Recommend ongoing therapy.   Pt participated in setting treatment goals.    Pt wants to learn how to manage situations that will keep her happy and others happy too. Pt wants to improve coping skills and self esteem.   Plan to meet every two weeks.    Treatment Plan (treatment plan target date: 10/25/2022) Client Abilities/Strengths  Pt is bright, engaging, and motivated for therapy.  Client Treatment Preferences  Individual therapy.  Client Statement of Needs  Improve copings skills and understand herself better. Improve self esteem.  Symptoms  Depressed or irritable mood. Excessive and/or unrealistic worry that is difficult to control occurring more days than not for at least 6 months about a number of events or activities. Hypervigilance (e.g., feeling constantly on edge, experiencing concentration difficulties, having trouble falling or staying asleep, exhibiting a general state of irritability). Low self-esteem. Problems Addressed  Unipolar Depression, Anxiety Goals 1. Alleviate depressive symptoms and return to previous level of effective functioning. 2. Appropriately grieve the loss in order to normalize mood and to return to previously adaptive level of functioning. Objective Learn and implement behavioral strategies to overcome depression. Target Date: 2022-10-25 Frequency: Biweekly  Progress: 10 Modality: individual  Related Interventions Assist the client in developing skills that increase the likelihood of deriving pleasure from behavioral activation (e.g., assertiveness skills, developing an exercise plan, less internal/more external focus, increased social involvement); reinforce success. Engage the client in "behavioral activation," increasing his/her activity  level and contact with sources of reward, while identifying processes that inhibit activation. use behavioral techniques such as instruction, rehearsal,  role-playing, role reversal, as needed, to facilitate activity in the client's daily life; reinforce success. 3. Develop healthy interpersonal relationships that lead to the alleviation and help prevent the relapse of depression. 4. Develop healthy thinking patterns and beliefs about self, others, and the world that lead to the alleviation and help prevent the relapse of depression. 5. Enhance ability to effectively cope with the full variety of life's worries and anxieties. 6. Learn and implement coping skills that result in a reduction of anxiety and worry, and improved daily functioning. Objective Learn and implement problem-solving strategies for realistically addressing worries. Target Date: 2022-10-25 Frequency: Biweekly  Progress: 10 Modality: individual  Related Interventions Assign the client a homework exercise in which he/she problem-solves a current problem (see Mastery of Your Anxiety and Worry: Workbook by Adora Fridge and Eliot Ford or Generalized Anxiety Disorder by Eather Colas, and Eliot Ford); review, reinforce success, and provide corrective feedback toward improvement. Teach the client problem-solving strategies involving specifically defining a problem, generating options for addressing it, evaluating the pros and cons of each option, selecting and implementing an optional action, and reevaluating and refining the action. Objective Learn and implement calming skills to reduce overall anxiety and manage anxiety symptoms. Target Date: 2022-10-25 Frequency: Biweekly  Progress: 10 Modality: individual  Related Interventions Assign the client to read about progressive muscle relaxation and other calming strategies in relevant books or treatment manuals (e.g., Progressive Relaxation Training by Leroy Kennedy; Mastery of Your Anxiety and Worry: Workbook by Beckie Busing). Assign the client homework each session in which he/she practices relaxation exercises daily, gradually applying  them progressively from non-anxiety-provoking to anxiety-provoking situations; review and reinforce success while providing corrective feedback toward improvement. Teach the client calming/relaxation skills (e.g., applied relaxation, progressive muscle relaxation, cue controlled relaxation; mindful breathing; biofeedback) and how to discriminate better between relaxation and tension; teach the client how to apply these skills to his/her daily life. 7. Recognize, accept, and cope with feelings of depression. 8. Reduce overall frequency, intensity, and duration of the anxiety so that daily functioning is not impaired. 9. Resolve the core conflict that is the source of anxiety. 10. Stabilize anxiety level while increasing ability to function on a daily basis. Diagnosis F43.23  Conditions For Discharge Achievement of treatment goals and objectives   Clint Bolder, LCSW

## 2022-01-03 ENCOUNTER — Ambulatory Visit: Payer: 59 | Admitting: Psychology

## 2022-01-18 ENCOUNTER — Ambulatory Visit (INDEPENDENT_AMBULATORY_CARE_PROVIDER_SITE_OTHER): Payer: 59 | Admitting: Psychology

## 2022-01-18 DIAGNOSIS — F4323 Adjustment disorder with mixed anxiety and depressed mood: Secondary | ICD-10-CM

## 2022-01-18 NOTE — Progress Notes (Signed)
[facility] Counselor/Therapist Progress Note  Patient ID: Lisa Patterson, MRN: [id number],    Date: 01/18/2022  Time Spent: 4:00pm-4:55pm   55 minutes   Treatment Type: Individual Therapy  Reported Symptoms: sadness, anxiety  Mental Status Exam: Appearance:  Casual and Well Groomed     Behavior: Appropriate  Motor: Normal  Speech/Language:  Normal Rate  Affect: Appropriate  Mood: normal  Thought process: normal  Thought content:   WNL  Sensory/Perceptual disturbances:   WNL  Orientation: oriented to person, place, time/date, and situation  Attention: Good  Concentration: Good  Memory: WNL  Fund of knowledge:  Good  Insight:   Good  Judgment:  Good  Impulse Control: Good   Risk Assessment: Danger to Self:  No Self-injurious Behavior: No Danger to Others: No Duty to Warn:no Physical Aggression / Violence:No  Access to Firearms a concern: No  Gang Involvement:No   Subjective: Pt present for face-to-face individual therapy via video Webex.  Pt consents to telehealth video session due to COVID 19 pandemic. Location of pt: home Location of therapist: home office.  Pt talked about work.  Things have very busy.   At times that makes pt anxious.  Addressed pt's anxiety and worked on Optician, dispensing.  Pt talked about her mother who is having back surgery November 27th.   Pt will take FMLA to help take care of her mother.  Pt is worried about her mother but her mother is also challenging to deal with.   Pt's mother always has a medical concern that she is hyper focused on.  Helped pt process her feelings and family dynamics.  Pt is close to all 3 of her brothers who help pt out at times with communicating with their mother.  Pt's brothers see pt as a mother figure.   Pt was really worried about her husband's job but it all worked out and he is back at his position that works well for their schedule and finances. Pt talked about wanting to talk with a [facility]  consultant to look for other employment opportunities.  She is disappointed that she missed an opportunity yesterday to talk with the consultant.   Pt is working on adjusting her thoughts and worries.  She is putting into practice the strategies we discuss in therapy.   Worked on how pt can increase self care.  Provided supportive therapy.      Interventions: Cognitive Behavioral Therapy and Insight-Oriented  Diagnosis: F43.23  Plan: Plan of Care: Recommend ongoing therapy.   Pt participated in setting treatment goals.    Pt wants to learn how to manage situations that will keep her happy and others happy too. Pt wants to improve coping skills and self esteem.   Plan to meet every two weeks.    Treatment Plan (treatment plan target date: 10/25/2022) Client Abilities/Strengths  Pt is bright, engaging, and motivated for therapy.  Client Treatment Preferences  Individual therapy.  Client Statement of Needs  Improve copings skills and understand herself better. Improve self esteem.  Symptoms  Depressed or irritable mood. Excessive and/or unrealistic worry that is difficult to control occurring more days than not for at least 6 months about a number of events or activities. Hypervigilance (e.g., feeling constantly on edge, experiencing concentration difficulties, having trouble falling or staying asleep, exhibiting a general state of irritability). Low self-esteem. Problems Addressed  Unipolar Depression, Anxiety Goals 1. Alleviate depressive symptoms and return to previous level of effective functioning. 2. Appropriately grieve the loss in  order to normalize mood and to return to previously adaptive level of functioning. Objective Learn and implement behavioral strategies to overcome depression. Target Date: 2022-10-25 Frequency: Biweekly  Progress: 10 Modality: individual  Related Interventions Assist the client in developing skills that increase the likelihood of deriving pleasure from  behavioral activation (e.g., assertiveness skills, developing an exercise plan, less internal/more external focus, increased social involvement); reinforce success. Engage the client in "behavioral activation," increasing his/her activity level and contact with sources of reward, while identifying processes that inhibit activation. use behavioral techniques such as instruction, rehearsal, role-playing, role reversal, as needed, to facilitate activity in the client's daily life; reinforce success. 3. Develop healthy interpersonal relationships that lead to the alleviation and help prevent the relapse of depression. 4. Develop healthy thinking patterns and beliefs about self, others, and the world that lead to the alleviation and help prevent the relapse of depression. 5. Enhance ability to effectively cope with the full variety of life's worries and anxieties. 6. Learn and implement coping skills that result in a reduction of anxiety and worry, and improved daily functioning. Objective Learn and implement problem-solving strategies for realistically addressing worries. Target Date: 2022-10-25 Frequency: Biweekly  Progress: 10 Modality: individual  Related Interventions Assign the client a homework exercise in which he/she problem-solves a current problem (see Mastery of Your Anxiety and Worry: Workbook by Elenora Fender and Filbert Schilder or Generalized Anxiety Disorder by Elesa Hacker, and Filbert Schilder); review, reinforce success, and provide corrective feedback toward improvement. Teach the client problem-solving strategies involving specifically defining a problem, generating options for addressing it, evaluating the pros and cons of each option, selecting and implementing an optional action, and reevaluating and refining the action. Objective Learn and implement calming skills to reduce overall anxiety and manage anxiety symptoms. Target Date: 2022-10-25 Frequency: Biweekly  Progress: 10 Modality: individual   Related Interventions Assign the client to read about progressive muscle relaxation and other calming strategies in relevant books or treatment manuals (e.g., Progressive Relaxation Training by Twana First; Mastery of Your Anxiety and Worry: Workbook by Earlie Counts). Assign the client homework each session in which he/she practices relaxation exercises daily, gradually applying them progressively from non-anxiety-provoking to anxiety-provoking situations; review and reinforce success while providing corrective feedback toward improvement. Teach the client calming/relaxation skills (e.g., applied relaxation, progressive muscle relaxation, cue controlled relaxation; mindful breathing; biofeedback) and how to discriminate better between relaxation and tension; teach the client how to apply these skills to his/her daily life. 7. Recognize, accept, and cope with feelings of depression. 8. Reduce overall frequency, intensity, and duration of the anxiety so that daily functioning is not impaired. 9. Resolve the core conflict that is the source of anxiety. 10. Stabilize anxiety level while increasing ability to function on a daily basis. Diagnosis F43.23  Conditions For Discharge Achievement of treatment goals and objectives   Salomon Fick, LCSW

## 2022-02-02 ENCOUNTER — Ambulatory Visit (INDEPENDENT_AMBULATORY_CARE_PROVIDER_SITE_OTHER): Payer: 59 | Admitting: Family Medicine

## 2022-02-02 ENCOUNTER — Encounter: Payer: Self-pay | Admitting: Family Medicine

## 2022-02-02 VITALS — BP 112/60 | HR 78 | Temp 97.8°F | Ht 61.0 in | Wt 129.2 lb

## 2022-02-02 DIAGNOSIS — R7989 Other specified abnormal findings of blood chemistry: Secondary | ICD-10-CM

## 2022-02-02 DIAGNOSIS — Z0000 Encounter for general adult medical examination without abnormal findings: Secondary | ICD-10-CM | POA: Diagnosis not present

## 2022-02-02 DIAGNOSIS — E559 Vitamin D deficiency, unspecified: Secondary | ICD-10-CM | POA: Diagnosis not present

## 2022-02-02 DIAGNOSIS — Z23 Encounter for immunization: Secondary | ICD-10-CM | POA: Diagnosis not present

## 2022-02-02 DIAGNOSIS — I10 Essential (primary) hypertension: Secondary | ICD-10-CM | POA: Diagnosis not present

## 2022-02-02 LAB — CBC WITH DIFFERENTIAL/PLATELET
Basophils Absolute: 0 10*3/uL (ref 0.0–0.1)
Basophils Relative: 0.7 % (ref 0.0–3.0)
Eosinophils Absolute: 0.1 10*3/uL (ref 0.0–0.7)
Eosinophils Relative: 1.4 % (ref 0.0–5.0)
HCT: 38.9 % (ref 36.0–46.0)
Hemoglobin: 12.9 g/dL (ref 12.0–15.0)
Lymphocytes Relative: 39.2 % (ref 12.0–46.0)
Lymphs Abs: 1.4 10*3/uL (ref 0.7–4.0)
MCHC: 33.2 g/dL (ref 30.0–36.0)
MCV: 86 fl (ref 78.0–100.0)
Monocytes Absolute: 0.4 10*3/uL (ref 0.1–1.0)
Monocytes Relative: 11 % (ref 3.0–12.0)
Neutro Abs: 1.8 10*3/uL (ref 1.4–7.7)
Neutrophils Relative %: 47.7 % (ref 43.0–77.0)
Platelets: 314 10*3/uL (ref 150.0–400.0)
RBC: 4.53 Mil/uL (ref 3.87–5.11)
RDW: 14.7 % (ref 11.5–15.5)
WBC: 3.7 10*3/uL — ABNORMAL LOW (ref 4.0–10.5)

## 2022-02-02 LAB — BASIC METABOLIC PANEL
BUN: 8 mg/dL (ref 6–23)
CO2: 26 mEq/L (ref 19–32)
Calcium: 8.9 mg/dL (ref 8.4–10.5)
Chloride: 104 mEq/L (ref 96–112)
Creatinine, Ser: 0.7 mg/dL (ref 0.40–1.20)
GFR: 101.73 mL/min (ref >60.00)
Glucose, Bld: 84 mg/dL (ref 70–99)
Potassium: 4.3 mEq/L (ref 3.5–5.1)
Sodium: 137 mEq/L (ref 135–145)

## 2022-02-02 LAB — HEPATIC FUNCTION PANEL
ALT: 9 U/L (ref 0–35)
AST: 14 U/L (ref 0–37)
Albumin: 4.5 g/dL (ref 3.5–5.2)
Alkaline Phosphatase: 83 U/L (ref 39–117)
Bilirubin, Direct: 0.1 mg/dL (ref 0.0–0.3)
Total Bilirubin: 1.1 mg/dL (ref 0.2–1.2)
Total Protein: 7.6 g/dL (ref 6.0–8.3)

## 2022-02-02 LAB — TSH: TSH: 1.66 u[IU]/mL (ref 0.35–5.50)

## 2022-02-02 LAB — LIPID PANEL
Cholesterol: 223 mg/dL — ABNORMAL HIGH (ref 0–200)
HDL: 62.8 mg/dL (ref >39.00)
LDL Cholesterol: 138 mg/dL — ABNORMAL HIGH (ref 0–99)
NonHDL: 159.71
Total CHOL/HDL Ratio: 4
Triglycerides: 107 mg/dL (ref 0.0–149.0)
VLDL: 21.4 mg/dL (ref 0.0–40.0)

## 2022-02-02 LAB — VITAMIN D 25 HYDROXY (VIT D DEFICIENCY, FRACTURES): VITD: 21.63 ng/mL — ABNORMAL LOW (ref 30.00–100.00)

## 2022-02-02 NOTE — Assessment & Plan Note (Signed)
Check labs and replete prn. 

## 2022-02-02 NOTE — Assessment & Plan Note (Signed)
Pt's PE WNL.  UTD on pap, mammo, colonoscopy.  Tdap updated.  Check labs.  Anticipatory guidance provided.

## 2022-02-02 NOTE — Patient Instructions (Addendum)
Follow up in 6 months to recheck BP We'll notify you of your lab results and make any changes if needed Keep up the good work- you look fantastic!!! Call with any questions or concerns Stay Safe!  Stay Healthy! Happy Holidays!!!

## 2022-02-02 NOTE — Progress Notes (Signed)
   Subjective:    Patient ID: Lisa Patterson, female    DOB: [birth year], 51 y.o.   MRN: [id number]  HPI CPE- UTD on pap, mammo, colonoscopy.  Due for Tdap  Patient Care Team    Relationship Specialty Notifications Start End  Sheliah Hatch, MD PCP - General Family Medicine  12/30/13    Comment: Wilhemena Durie, Madelaine Etienne, MD Consulting Physician Obstetrics and Gynecology  10/15/16     Health Maintenance  Topic Date Due   DTaP/Tdap/Td (7 - Td or Tdap) 01/21/2022   PAP SMEAR-Modifier  02/02/2022 (Originally 12/31/2018)   COVID-19 Vaccine (3 - 2023-24 season) 02/18/2022 (Originally 11/03/2021)   Hepatitis C Screening  08/05/2022 (Originally 12/01/1990)   HIV Screening  08/05/2022 (Originally 12/01/1987)   MAMMOGRAM  07/04/2022   COLONOSCOPY (Pts 45-31yrs Insurance coverage will need to be confirmed)  03/24/2031   INFLUENZA VACCINE  Completed   HPV VACCINES  Aged Out     Review of Systems Patient reports no vision/ hearing changes, adenopathy,fever, weight change,  persistant/recurrent hoarseness , swallowing issues, chest pain, palpitations, edema, persistant/recurrent cough, hemoptysis, dyspnea (rest/exertional/paroxysmal nocturnal), gastrointestinal bleeding (melena, rectal bleeding), abdominal pain, significant heartburn, bowel changes, GU symptoms (dysuria, hematuria, incontinence), Gyn symptoms (abnormal  bleeding, pain),  syncope, focal weakness, memory loss, numbness & tingling, skin/hair/nail changes, abnormal bruising or bleeding, anxiety, or depression.     Objective:   Physical Exam General Appearance:    Alert, cooperative, no distress, appears stated age  Head:    Normocephalic, without obvious abnormality, atraumatic  Eyes:    PERRL, conjunctiva/corneas clear, EOM's intact both eyes  Ears:    Normal TM's and external ear canals, both ears  Nose:   Nares normal, septum midline, mucosa normal, no drainage    or sinus tenderness  Throat:   Lips, mucosa, and tongue  normal; teeth and gums normal  Neck:   Supple, symmetrical, trachea midline, no adenopathy;    Thyroid: no enlargement/tenderness/nodules  Back:     Symmetric, no curvature, ROM normal, no CVA tenderness  Lungs:     Clear to auscultation bilaterally, respirations unlabored  Chest Wall:    No tenderness or deformity   Heart:    Regular rate and rhythm, S1 and S2 normal, no murmur, rub   or gallop  Breast Exam:    Deferred to GYN  Abdomen:     Soft, non-tender, bowel sounds active all four quadrants,    no masses, no organomegaly  Genitalia:    Deferred to GYN  Rectal:    Extremities:   Extremities normal, atraumatic, no cyanosis or edema  Pulses:   2+ and symmetric all extremities  Skin:   Skin color, texture, turgor normal, no rashes or lesions  Lymph nodes:   Cervical, supraclavicular, and axillary nodes normal  Neurologic:   CNII-XII intact, normal strength, sensation and reflexes    throughout          Assessment & Plan:

## 2022-02-02 NOTE — Assessment & Plan Note (Signed)
Chronic problem.  Well controlled.  Asymptomatic.  Check labs but no anticipated med changes.

## 2022-02-05 ENCOUNTER — Other Ambulatory Visit (HOSPITAL_BASED_OUTPATIENT_CLINIC_OR_DEPARTMENT_OTHER): Payer: Self-pay

## 2022-02-05 MED ORDER — VITAMIN D (ERGOCALCIFEROL) 1.25 MG (50000 UNIT) PO CAPS
50000.0000 [IU] | ORAL_CAPSULE | ORAL | 0 refills | Status: DC
  Filled 2022-02-05: qty 12, 84d supply, fill #0

## 2022-02-05 NOTE — Addendum Note (Signed)
Addended by: Eldred Manges on: 02/05/2022 04:29 PM   Modules accepted: Orders

## 2022-02-06 ENCOUNTER — Ambulatory Visit (INDEPENDENT_AMBULATORY_CARE_PROVIDER_SITE_OTHER): Payer: 59 | Admitting: Psychology

## 2022-02-06 DIAGNOSIS — F4323 Adjustment disorder with mixed anxiety and depressed mood: Secondary | ICD-10-CM | POA: Diagnosis not present
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
## 2022-02-15 ENCOUNTER — Other Ambulatory Visit (HOSPITAL_BASED_OUTPATIENT_CLINIC_OR_DEPARTMENT_OTHER): Payer: Self-pay

## 2022-02-15 ENCOUNTER — Other Ambulatory Visit: Payer: Self-pay | Admitting: Family Medicine

## 2022-02-15 MED ORDER — METOPROLOL SUCCINATE ER 25 MG PO TB24
25.0000 mg | ORAL_TABLET | Freq: Every day | ORAL | 11 refills | Status: DC
Start: 2022-02-15 — End: 2022-09-13
  Filled 2022-02-15: qty 30, 30d supply, fill #0
  Filled 2022-03-22: qty 30, 30d supply, fill #1
  Filled 2022-04-23: qty 30, 30d supply, fill #2
  Filled 2022-05-28: qty 30, 30d supply, fill #3
  Filled 2022-06-26: qty 30, 30d supply, fill #4
  Filled 2022-07-23: qty 30, 30d supply, fill #5
  Filled 2022-08-20: qty 30, 30d supply, fill #6

## 2022-02-15 MED ORDER — AMLODIPINE BESYLATE 5 MG PO TABS
5.0000 mg | ORAL_TABLET | Freq: Every day | ORAL | 11 refills | Status: DC
  Filled 2022-02-15: qty 30, 30d supply, fill #0
  Filled 2022-03-22: qty 30, 30d supply, fill #1
  Filled 2022-04-23: qty 30, 30d supply, fill #2
  Filled 2022-05-28 (×2): qty 30, 30d supply, fill #3
  Filled 2022-06-26: qty 30, 30d supply, fill #4
  Filled 2022-07-23: qty 30, 30d supply, fill #5
  Filled 2022-08-20: qty 30, 30d supply, fill #6

## 2022-02-20 ENCOUNTER — Ambulatory Visit (INDEPENDENT_AMBULATORY_CARE_PROVIDER_SITE_OTHER): Payer: 59 | Admitting: Psychology

## 2022-02-20 DIAGNOSIS — F4323 Adjustment disorder with mixed anxiety and depressed mood: Secondary | ICD-10-CM

## 2022-02-20 NOTE — Progress Notes (Signed)
[facility] Counselor/Therapist Progress Note  Patient ID: Lisa Patterson, MRN: [id number],    Date: 02/20/2022  Time Spent: 4:00pm-4:55pm   55 minutes   Treatment Type: Individual Therapy  Reported Symptoms: anxiety  Mental Status Exam: Appearance:  Casual and Well Groomed     Behavior: Appropriate  Motor: Normal  Speech/Language:  Normal Rate  Affect: Appropriate  Mood: normal  Thought process: normal  Thought content:   WNL  Sensory/Perceptual disturbances:   WNL  Orientation: oriented to person, place, time/date, and situation  Attention: Good  Concentration: Good  Memory: WNL  Fund of knowledge:  Good  Insight:   Good  Judgment:  Good  Impulse Control: Good   Risk Assessment: Danger to Self:  No Self-injurious Behavior: No Danger to Others: No Duty to Warn:no Physical Aggression / Violence:No  Access to Firearms a concern: No  Gang Involvement:No   Subjective: Pt present for face-to-face individual therapy via video Webex.  Pt consents to telehealth video session due to COVID 19 pandemic. Location of pt: home Location of therapist: home office.  Pt talked about work.   Pt talked about her family.  She is very close to her  children who are 46 and 67. Pt talked about having trouble sleeping the past few nights.  Her racing thoughts have kept her awake.   Addressed pt's worry thoughts.  Worked on worry management.  Encouraged pt to journal her thoughts.   Worked on how pt can increase self care.  Provided supportive therapy.      Interventions: Cognitive Behavioral Therapy and Insight-Oriented  Diagnosis: F43.23  Plan: Plan of Care: Recommend ongoing therapy.   Pt participated in setting treatment goals.    Pt wants to learn how to manage situations that will keep her happy and others happy too. Pt wants to improve coping skills and self esteem.   Plan to meet every two weeks.    Treatment Plan (treatment plan target date: 10/25/2022) Client  Abilities/Strengths  Pt is bright, engaging, and motivated for therapy.  Client Treatment Preferences  Individual therapy.  Client Statement of Needs  Improve copings skills and understand herself better. Improve self esteem.  Symptoms  Depressed or irritable mood. Excessive and/or unrealistic worry that is difficult to control occurring more days than not for at least 6 months about a number of events or activities. Hypervigilance (e.g., feeling constantly on edge, experiencing concentration difficulties, having trouble falling or staying asleep, exhibiting a general state of irritability). Low self-esteem. Problems Addressed  Unipolar Depression, Anxiety Goals 1. Alleviate depressive symptoms and return to previous level of effective functioning. 2. Appropriately grieve the loss in order to normalize mood and to return to previously adaptive level of functioning. Objective Learn and implement behavioral strategies to overcome depression. Target Date: 2022-10-25 Frequency: Biweekly  Progress: 10 Modality: individual  Related Interventions Assist the client in developing skills that increase the likelihood of deriving pleasure from behavioral activation (e.g., assertiveness skills, developing an exercise plan, less internal/more external focus, increased social involvement); reinforce success. Engage the client in "behavioral activation," increasing his/her activity level and contact with sources of reward, while identifying processes that inhibit activation. use behavioral techniques such as instruction, rehearsal, role-playing, role reversal, as needed, to facilitate activity in the client's daily life; reinforce success. 3. Develop healthy interpersonal relationships that lead to the alleviation and help prevent the relapse of depression. 4. Develop healthy thinking patterns and beliefs about self, others, and the world that lead to the alleviation  and help prevent the relapse of  depression. 5. Enhance ability to effectively cope with the full variety of life's worries and anxieties. 6. Learn and implement coping skills that result in a reduction of anxiety and worry, and improved daily functioning. Objective Learn and implement problem-solving strategies for realistically addressing worries. Target Date: 2022-10-25 Frequency: Biweekly  Progress: 10 Modality: individual  Related Interventions Assign the client a homework exercise in which he/she problem-solves a current problem (see Mastery of Your Anxiety and Worry: Workbook by Elenora Fender and Filbert Schilder or Generalized Anxiety Disorder by Elesa Hacker, and Filbert Schilder); review, reinforce success, and provide corrective feedback toward improvement. Teach the client problem-solving strategies involving specifically defining a problem, generating options for addressing it, evaluating the pros and cons of each option, selecting and implementing an optional action, and reevaluating and refining the action. Objective Learn and implement calming skills to reduce overall anxiety and manage anxiety symptoms. Target Date: 2022-10-25 Frequency: Biweekly  Progress: 10 Modality: individual  Related Interventions Assign the client to read about progressive muscle relaxation and other calming strategies in relevant books or treatment manuals (e.g., Progressive Relaxation Training by Twana First; Mastery of Your Anxiety and Worry: Workbook by Earlie Counts). Assign the client homework each session in which he/she practices relaxation exercises daily, gradually applying them progressively from non-anxiety-provoking to anxiety-provoking situations; review and reinforce success while providing corrective feedback toward improvement. Teach the client calming/relaxation skills (e.g., applied relaxation, progressive muscle relaxation, cue controlled relaxation; mindful breathing; biofeedback) and how to discriminate better between relaxation  and tension; teach the client how to apply these skills to his/her daily life. 7. Recognize, accept, and cope with feelings of depression. 8. Reduce overall frequency, intensity, and duration of the anxiety so that daily functioning is not impaired. 9. Resolve the core conflict that is the source of anxiety. 10. Stabilize anxiety level while increasing ability to function on a daily basis. Diagnosis F43.23  Conditions For Discharge Achievement of treatment goals and objectives   Salomon Fick, LCSW

## 2022-03-22 ENCOUNTER — Other Ambulatory Visit (HOSPITAL_BASED_OUTPATIENT_CLINIC_OR_DEPARTMENT_OTHER): Payer: Self-pay

## 2022-03-26 ENCOUNTER — Ambulatory Visit (INDEPENDENT_AMBULATORY_CARE_PROVIDER_SITE_OTHER): Payer: Commercial Managed Care - PPO | Admitting: Psychology

## 2022-03-26 DIAGNOSIS — F4323 Adjustment disorder with mixed anxiety and depressed mood: Secondary | ICD-10-CM | POA: Diagnosis not present

## 2022-03-26 NOTE — Progress Notes (Signed)
[facility] Counselor/Therapist Progress Note  Patient ID: Nettye Flegal, MRN: [id number],    Date: 03/26/2022  Time Spent: 4:00pm-4:55pm   55 minutes   Treatment Type: Individual Therapy  Reported Symptoms: anxiety  Mental Status Exam: Appearance:  Casual and Well Groomed     Behavior: Appropriate  Motor: Normal  Speech/Language:  Normal Rate  Affect: Appropriate  Mood: normal  Thought process: normal  Thought content:   WNL  Sensory/Perceptual disturbances:   WNL  Orientation: oriented to person, place, time/date, and situation  Attention: Good  Concentration: Good  Memory: WNL  Fund of knowledge:  Good  Insight:   Good  Judgment:  Good  Impulse Control: Good   Risk Assessment: Danger to Self:  No Self-injurious Behavior: No Danger to Others: No Duty to Warn:no Physical Aggression / Violence:No  Access to Firearms a concern: No  Gang Involvement:No   Subjective: Pt present for face-to-face individual therapy via video Webex.  Pt consents to telehealth video session due to COVID 19 pandemic. Location of pt: home Location of therapist: home office.  Pt talked about work.  Things have been very busy.   Addressed work stress.   Worked on Child psychotherapist.   Pt talked about her niece getting married Jan. 14th.   Pt had to help her parents get ready for the wedding.  Pt's mother had surgery in November and is still complaining about pain issues.  Pt's mother can be very challenging to deal with.  Pt is exhausted from all she has to do for her mother.  Pt was tearful as she talked about interactions with her mother.  Helped pt process her feelings and family dynamics.   Pt is beginning the process of looking for a house to buy.   Pt is looking forward to finding a house but is worried about the decision making.  Addressed pt's worries.  Worked on how pt can increase self care.  Provided supportive therapy.      Interventions: Cognitive Behavioral Therapy  and Insight-Oriented  Diagnosis: F43.23  Plan: Plan of Care: Recommend ongoing therapy.   Pt participated in setting treatment goals.    Pt wants to learn how to manage situations that will keep her happy and others happy too. Pt wants to improve coping skills and self esteem.   Plan to meet every two weeks.    Treatment Plan (treatment plan target date: 10/25/2022) Client Abilities/Strengths  Pt is bright, engaging, and motivated for therapy.  Client Treatment Preferences  Individual therapy.  Client Statement of Needs  Improve copings skills and understand herself better. Improve self esteem.  Symptoms  Depressed or irritable mood. Excessive and/or unrealistic worry that is difficult to control occurring more days than not for at least 6 months about a number of events or activities. Hypervigilance (e.g., feeling constantly on edge, experiencing concentration difficulties, having trouble falling or staying asleep, exhibiting a general state of irritability). Low self-esteem. Problems Addressed  Unipolar Depression, Anxiety Goals 1. Alleviate depressive symptoms and return to previous level of effective functioning. 2. Appropriately grieve the loss in order to normalize mood and to return to previously adaptive level of functioning. Objective Learn and implement behavioral strategies to overcome depression. Target Date: 2022-10-25 Frequency: Biweekly  Progress: 10 Modality: individual  Related Interventions Assist the client in developing skills that increase the likelihood of deriving pleasure from behavioral activation (e.g., assertiveness skills, developing an exercise plan, less internal/more external focus, increased social involvement); reinforce success. Engage the client  in "behavioral activation," increasing his/her activity level and contact with sources of reward, while identifying processes that inhibit activation. use behavioral techniques such as instruction, rehearsal,  role-playing, role reversal, as needed, to facilitate activity in the client's daily life; reinforce success. 3. Develop healthy interpersonal relationships that lead to the alleviation and help prevent the relapse of depression. 4. Develop healthy thinking patterns and beliefs about self, others, and the world that lead to the alleviation and help prevent the relapse of depression. 5. Enhance ability to effectively cope with the full variety of life's worries and anxieties. 6. Learn and implement coping skills that result in a reduction of anxiety and worry, and improved daily functioning. Objective Learn and implement problem-solving strategies for realistically addressing worries. Target Date: 2022-10-25 Frequency: Biweekly  Progress: 10 Modality: individual  Related Interventions Assign the client a homework exercise in which he/she problem-solves a current problem (see Mastery of Your Anxiety and Worry: Workbook by Adora Fridge and Eliot Ford or Generalized Anxiety Disorder by Eather Colas, and Eliot Ford); review, reinforce success, and provide corrective feedback toward improvement. Teach the client problem-solving strategies involving specifically defining a problem, generating options for addressing it, evaluating the pros and cons of each option, selecting and implementing an optional action, and reevaluating and refining the action. Objective Learn and implement calming skills to reduce overall anxiety and manage anxiety symptoms. Target Date: 2022-10-25 Frequency: Biweekly  Progress: 10 Modality: individual  Related Interventions Assign the client to read about progressive muscle relaxation and other calming strategies in relevant books or treatment manuals (e.g., Progressive Relaxation Training by Leroy Kennedy; Mastery of Your Anxiety and Worry: Workbook by Beckie Busing). Assign the client homework each session in which he/she practices relaxation exercises daily, gradually applying  them progressively from non-anxiety-provoking to anxiety-provoking situations; review and reinforce success while providing corrective feedback toward improvement. Teach the client calming/relaxation skills (e.g., applied relaxation, progressive muscle relaxation, cue controlled relaxation; mindful breathing; biofeedback) and how to discriminate better between relaxation and tension; teach the client how to apply these skills to his/her daily life. 7. Recognize, accept, and cope with feelings of depression. 8. Reduce overall frequency, intensity, and duration of the anxiety so that daily functioning is not impaired. 9. Resolve the core conflict that is the source of anxiety. 10. Stabilize anxiety level while increasing ability to function on a daily basis. Diagnosis F43.23  Conditions For Discharge Achievement of treatment goals and objectives   Clint Bolder, LCSW

## 2022-03-30 DIAGNOSIS — Z6824 Body mass index (BMI) 24.0-24.9, adult: Secondary | ICD-10-CM | POA: Diagnosis not present

## 2022-03-30 DIAGNOSIS — Z01419 Encounter for gynecological examination (general) (routine) without abnormal findings: Secondary | ICD-10-CM | POA: Diagnosis not present

## 2022-04-10 ENCOUNTER — Ambulatory Visit: Payer: Commercial Managed Care - PPO | Admitting: Psychology

## 2022-04-10 DIAGNOSIS — F4323 Adjustment disorder with mixed anxiety and depressed mood: Secondary | ICD-10-CM

## 2022-04-10 NOTE — Progress Notes (Signed)
[facility] Counselor/Therapist Progress Note  Patient ID: Janashia Parco, MRN: [id number],    Date: 04/10/2022  Time Spent: 4:00pm-4:55pm   55 minutes   Treatment Type: Individual Therapy  Reported Symptoms: anxiety  Mental Status Exam: Appearance:  Casual and Well Groomed     Behavior: Appropriate  Motor: Normal  Speech/Language:  Normal Rate  Affect: Appropriate  Mood: normal  Thought process: normal  Thought content:   WNL  Sensory/Perceptual disturbances:   WNL  Orientation: oriented to person, place, time/date, and situation  Attention: Good  Concentration: Good  Memory: WNL  Fund of knowledge:  Good  Insight:   Good  Judgment:  Good  Impulse Control: Good   Risk Assessment: Danger to Self:  No Self-injurious Behavior: No Danger to Others: No Duty to Warn:no Physical Aggression / Violence:No  Access to Firearms a concern: No  Gang Involvement:No   Subjective: Pt present for face-to-face individual therapy via video Webex.  Pt consents to telehealth video session due to COVID 19 pandemic. Location of pt: home Location of therapist: home office.  Pt talked about work.  It has been very busy but manageable.   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  At times that can be stressful for pt.  Worked on Child psychotherapist.   Pt talked about planning to buy a house.   The process of home buying has been very stressful.  Their realtor is not a good fit for them.  Pt is looking for a new realtor.  Addressed the difficulties and encouraged pt to advocate for her needs.  Worked on how pt can increase self care.  Provided supportive therapy.      Interventions: Cognitive Behavioral Therapy and Insight-Oriented  Diagnosis: F43.23  Plan: Plan of Care: Recommend ongoing therapy.   Pt participated in setting treatment goals.    Pt wants to learn how to manage situations that will keep her happy and others happy too. Pt wants to improve  coping skills and self esteem.   Plan to meet every two weeks.    Treatment Plan (treatment plan target date: 10/25/2022) Client Abilities/Strengths  Pt is bright, engaging, and motivated for therapy.  Client Treatment Preferences  Individual therapy.  Client Statement of Needs  Improve copings skills and understand herself better. Improve self esteem.  Symptoms  Depressed or irritable mood. Excessive and/or unrealistic worry that is difficult to control occurring more days than not for at least 6 months about a number of events or activities. Hypervigilance (e.g., feeling constantly on edge, experiencing concentration difficulties, having trouble falling or staying asleep, exhibiting a general state of irritability). Low self-esteem. Problems Addressed  Unipolar Depression, Anxiety Goals 1. Alleviate depressive symptoms and return to previous level of effective functioning. 2. Appropriately grieve the loss in order to normalize mood and to return to previously adaptive level of functioning. Objective Learn and implement behavioral strategies to overcome depression. Target Date: 2022-10-25 Frequency: Biweekly  Progress: 10 Modality: individual  Related Interventions Assist the client in developing skills that increase the likelihood of deriving pleasure from behavioral activation (e.g., assertiveness skills, developing an exercise plan, less internal/more external focus, increased social involvement); reinforce success. Engage the client in "behavioral activation," increasing his/her activity level and contact with sources of reward, while identifying processes that inhibit activation. use behavioral techniques such as instruction, rehearsal, role-playing, role reversal, as needed, to facilitate activity in the client's daily life; reinforce success. 3. Develop healthy interpersonal relationships that  lead to the alleviation and help prevent the relapse of depression. 4. Develop healthy  thinking patterns and beliefs about self, others, and the world that lead to the alleviation and help prevent the relapse of depression. 5. Enhance ability to effectively cope with the full variety of life's worries and anxieties. 6. Learn and implement coping skills that result in a reduction of anxiety and worry, and improved daily functioning. Objective Learn and implement problem-solving strategies for realistically addressing worries. Target Date: 2022-10-25 Frequency: Biweekly  Progress: 10 Modality: individual  Related Interventions Assign the client a homework exercise in which he/she problem-solves a current problem (see Mastery of Your Anxiety and Worry: Workbook by Adora Fridge and Eliot Ford or Generalized Anxiety Disorder by Eather Colas, and Eliot Ford); review, reinforce success, and provide corrective feedback toward improvement. Teach the client problem-solving strategies involving specifically defining a problem, generating options for addressing it, evaluating the pros and cons of each option, selecting and implementing an optional action, and reevaluating and refining the action. Objective Learn and implement calming skills to reduce overall anxiety and manage anxiety symptoms. Target Date: 2022-10-25 Frequency: Biweekly  Progress: 10 Modality: individual  Related Interventions Assign the client to read about progressive muscle relaxation and other calming strategies in relevant books or treatment manuals (e.g., Progressive Relaxation Training by Leroy Kennedy; Mastery of Your Anxiety and Worry: Workbook by Beckie Busing). Assign the client homework each session in which he/she practices relaxation exercises daily, gradually applying them progressively from non-anxiety-provoking to anxiety-provoking situations; review and reinforce success while providing corrective feedback toward improvement. Teach the client calming/relaxation skills (e.g., applied relaxation, progressive  muscle relaxation, cue controlled relaxation; mindful breathing; biofeedback) and how to discriminate better between relaxation and tension; teach the client how to apply these skills to his/her daily life. 7. Recognize, accept, and cope with feelings of depression. 8. Reduce overall frequency, intensity, and duration of the anxiety so that daily functioning is not impaired. 9. Resolve the core conflict that is the source of anxiety. 10. Stabilize anxiety level while increasing ability to function on a daily basis. Diagnosis F43.23  Conditions For Discharge Achievement of treatment goals and objectives   Clint Bolder, LCSW

## 2022-04-23 ENCOUNTER — Other Ambulatory Visit (HOSPITAL_BASED_OUTPATIENT_CLINIC_OR_DEPARTMENT_OTHER): Payer: Self-pay

## 2022-04-23 ENCOUNTER — Other Ambulatory Visit: Payer: Self-pay

## 2022-04-26 ENCOUNTER — Ambulatory Visit: Payer: Self-pay | Admitting: Psychology

## 2022-04-30 ENCOUNTER — Other Ambulatory Visit (HOSPITAL_BASED_OUTPATIENT_CLINIC_OR_DEPARTMENT_OTHER): Payer: Self-pay

## 2022-04-30 ENCOUNTER — Other Ambulatory Visit: Payer: Self-pay | Admitting: Family Medicine

## 2022-04-30 MED ORDER — OMEPRAZOLE 40 MG PO CPDR
40.0000 mg | DELAYED_RELEASE_CAPSULE | Freq: Every day | ORAL | 1 refills | Status: DC
  Filled 2022-04-30: qty 90, 90d supply, fill #0
  Filled 2022-07-24 – 2022-08-08 (×3): qty 90, 90d supply, fill #1

## 2022-05-10 ENCOUNTER — Ambulatory Visit (INDEPENDENT_AMBULATORY_CARE_PROVIDER_SITE_OTHER): Payer: Commercial Managed Care - PPO | Admitting: Psychology

## 2022-05-10 DIAGNOSIS — F4323 Adjustment disorder with mixed anxiety and depressed mood: Secondary | ICD-10-CM

## 2022-05-10 NOTE — Progress Notes (Signed)
[facility] Counselor/Therapist Progress Note  Patient ID: Lisa Patterson, MRN: [id number],    Date: 05/10/2022  Time Spent: 4:00pm-4:50pm   50 minutes   Treatment Type: Individual Therapy  Reported Symptoms: anxiety  Mental Status Exam: Appearance:  Casual and Well Groomed     Behavior: Appropriate  Motor: Normal  Speech/Language:  Normal Rate  Affect: Appropriate  Mood: normal  Thought process: normal  Thought content:   WNL  Sensory/Perceptual disturbances:   WNL  Orientation: oriented to person, place, time/date, and situation  Attention: Good  Concentration: Good  Memory: WNL  Fund of knowledge:  Good  Insight:   Good  Judgment:  Good  Impulse Control: Good   Risk Assessment: Danger to Self:  No Self-injurious Behavior: No Danger to Others: No Duty to Warn:no Physical Aggression / Violence:No  Access to Firearms a concern: No  Gang Involvement:No   Subjective: Pt present for face-to-face individual therapy via video Webex.  Pt consents to telehealth video session due to COVID 19 pandemic. Location of pt: home Location of therapist: home office.  Pt talked about having dental issues that was upsetting to her.   She found out she grinds her teeth bc of stress. Pt has felt a lot of stress about the house hunting experience.  They are not having any luck with finding a nice home in their price range.   Pt talked about work.  There may be some staff changes soon that will leave them understaffed.  That will make pt's job much more stressful and she is worried about it.  Worked on Child psychotherapist. Worked on how pt can increase self care.  Provided supportive therapy.      Interventions: Cognitive Behavioral Therapy and Insight-Oriented  Diagnosis: F43.23  Plan: Plan of Care: Recommend ongoing therapy.   Pt participated in setting treatment goals.    Pt wants to learn how to manage situations that will keep her happy and others happy too. Pt wants  to improve coping skills and self esteem.   Plan to meet every two weeks.    Treatment Plan (treatment plan target date: 10/25/2022) Client Abilities/Strengths  Pt is bright, engaging, and motivated for therapy.  Client Treatment Preferences  Individual therapy.  Client Statement of Needs  Improve copings skills and understand herself better. Improve self esteem.  Symptoms  Depressed or irritable mood. Excessive and/or unrealistic worry that is difficult to control occurring more days than not for at least 6 months about a number of events or activities. Hypervigilance (e.g., feeling constantly on edge, experiencing concentration difficulties, having trouble falling or staying asleep, exhibiting a general state of irritability). Low self-esteem. Problems Addressed  Unipolar Depression, Anxiety Goals 1. Alleviate depressive symptoms and return to previous level of effective functioning. 2. Appropriately grieve the loss in order to normalize mood and to return to previously adaptive level of functioning. Objective Learn and implement behavioral strategies to overcome depression. Target Date: 2022-10-25 Frequency: Biweekly  Progress: 10 Modality: individual  Related Interventions Assist the client in developing skills that increase the likelihood of deriving pleasure from behavioral activation (e.g., assertiveness skills, developing an exercise plan, less internal/more external focus, increased social involvement); reinforce success. Engage the client in "behavioral activation," increasing his/her activity level and contact with sources of reward, while identifying processes that inhibit activation. use behavioral techniques such as instruction, rehearsal, role-playing, role reversal, as needed, to facilitate activity in the client's daily life; reinforce success. 3. Develop healthy interpersonal relationships that lead to  the alleviation and help prevent the relapse of depression. 4. Develop  healthy thinking patterns and beliefs about self, others, and the world that lead to the alleviation and help prevent the relapse of depression. 5. Enhance ability to effectively cope with the full variety of life's worries and anxieties. 6. Learn and implement coping skills that result in a reduction of anxiety and worry, and improved daily functioning. Objective Learn and implement problem-solving strategies for realistically addressing worries. Target Date: 2022-10-25 Frequency: Biweekly  Progress: 10 Modality: individual  Related Interventions Assign the client a homework exercise in which he/she problem-solves a current problem (see Mastery of Your Anxiety and Worry: Workbook by Adora Fridge and Eliot Ford or Generalized Anxiety Disorder by Eather Colas, and Eliot Ford); review, reinforce success, and provide corrective feedback toward improvement. Teach the client problem-solving strategies involving specifically defining a problem, generating options for addressing it, evaluating the pros and cons of each option, selecting and implementing an optional action, and reevaluating and refining the action. Objective Learn and implement calming skills to reduce overall anxiety and manage anxiety symptoms. Target Date: 2022-10-25 Frequency: Biweekly  Progress: 10 Modality: individual  Related Interventions Assign the client to read about progressive muscle relaxation and other calming strategies in relevant books or treatment manuals (e.g., Progressive Relaxation Training by Leroy Kennedy; Mastery of Your Anxiety and Worry: Workbook by Beckie Busing). Assign the client homework each session in which he/she practices relaxation exercises daily, gradually applying them progressively from non-anxiety-provoking to anxiety-provoking situations; review and reinforce success while providing corrective feedback toward improvement. Teach the client calming/relaxation skills (e.g., applied relaxation,  progressive muscle relaxation, cue controlled relaxation; mindful breathing; biofeedback) and how to discriminate better between relaxation and tension; teach the client how to apply these skills to his/her daily life. 7. Recognize, accept, and cope with feelings of depression. 8. Reduce overall frequency, intensity, and duration of the anxiety so that daily functioning is not impaired. 9. Resolve the core conflict that is the source of anxiety. 10. Stabilize anxiety level while increasing ability to function on a daily basis. Diagnosis F43.23  Conditions For Discharge Achievement of treatment goals and objectives   Clint Bolder, LCSW

## 2022-05-28 ENCOUNTER — Other Ambulatory Visit (HOSPITAL_BASED_OUTPATIENT_CLINIC_OR_DEPARTMENT_OTHER): Payer: Self-pay

## 2022-06-05 ENCOUNTER — Ambulatory Visit (INDEPENDENT_AMBULATORY_CARE_PROVIDER_SITE_OTHER): Payer: Commercial Managed Care - PPO | Admitting: Psychology

## 2022-06-05 DIAGNOSIS — F4323 Adjustment disorder with mixed anxiety and depressed mood: Secondary | ICD-10-CM | POA: Diagnosis not present
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
## 2022-06-26 ENCOUNTER — Other Ambulatory Visit (HOSPITAL_BASED_OUTPATIENT_CLINIC_OR_DEPARTMENT_OTHER): Payer: Self-pay

## 2022-07-10 ENCOUNTER — Ambulatory Visit: Payer: Commercial Managed Care - PPO | Admitting: Psychology

## 2022-07-13 ENCOUNTER — Other Ambulatory Visit (HOSPITAL_BASED_OUTPATIENT_CLINIC_OR_DEPARTMENT_OTHER): Payer: Self-pay | Admitting: Family Medicine

## 2022-07-13 DIAGNOSIS — Z1231 Encounter for screening mammogram for malignant neoplasm of breast: Secondary | ICD-10-CM

## 2022-07-18 ENCOUNTER — Other Ambulatory Visit (HOSPITAL_BASED_OUTPATIENT_CLINIC_OR_DEPARTMENT_OTHER): Payer: Self-pay

## 2022-07-19 ENCOUNTER — Ambulatory Visit: Payer: Commercial Managed Care - PPO | Admitting: Psychology

## 2022-07-23 ENCOUNTER — Ambulatory Visit (HOSPITAL_BASED_OUTPATIENT_CLINIC_OR_DEPARTMENT_OTHER)
Admission: RE | Admit: 2022-07-23 | Discharge: 2022-07-23 | Disposition: A | Discharge status: Home / Self Care | Payer: Commercial Managed Care - PPO | Source: Ambulatory Visit | Attending: Family Medicine | Admitting: Family Medicine

## 2022-07-23 ENCOUNTER — Encounter (HOSPITAL_BASED_OUTPATIENT_CLINIC_OR_DEPARTMENT_OTHER): Payer: Self-pay

## 2022-07-23 DIAGNOSIS — Z1231 Encounter for screening mammogram for malignant neoplasm of breast: Secondary | ICD-10-CM | POA: Insufficient documentation

## 2022-08-01 ENCOUNTER — Other Ambulatory Visit (HOSPITAL_BASED_OUTPATIENT_CLINIC_OR_DEPARTMENT_OTHER): Payer: Self-pay

## 2022-08-07 ENCOUNTER — Ambulatory Visit (INDEPENDENT_AMBULATORY_CARE_PROVIDER_SITE_OTHER): Payer: Commercial Managed Care - PPO | Admitting: Psychology

## 2022-08-07 ENCOUNTER — Other Ambulatory Visit (HOSPITAL_BASED_OUTPATIENT_CLINIC_OR_DEPARTMENT_OTHER): Payer: Self-pay

## 2022-08-07 DIAGNOSIS — F4323 Adjustment disorder with mixed anxiety and depressed mood: Secondary | ICD-10-CM | POA: Diagnosis not present
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
## 2022-08-10 ENCOUNTER — Ambulatory Visit: Payer: Self-pay | Admitting: Family Medicine

## 2022-08-14 ENCOUNTER — Other Ambulatory Visit (HOSPITAL_BASED_OUTPATIENT_CLINIC_OR_DEPARTMENT_OTHER): Payer: Self-pay

## 2022-08-17 ENCOUNTER — Ambulatory Visit: Payer: Self-pay | Admitting: Family Medicine

## 2022-08-20 ENCOUNTER — Other Ambulatory Visit (HOSPITAL_BASED_OUTPATIENT_CLINIC_OR_DEPARTMENT_OTHER): Payer: Self-pay

## 2022-09-10 ENCOUNTER — Ambulatory Visit (INDEPENDENT_AMBULATORY_CARE_PROVIDER_SITE_OTHER): Payer: Commercial Managed Care - PPO | Admitting: Psychology

## 2022-09-10 DIAGNOSIS — F4323 Adjustment disorder with mixed anxiety and depressed mood: Secondary | ICD-10-CM | POA: Diagnosis not present

## 2022-09-10 NOTE — Progress Notes (Signed)
[facility] Counselor/Therapist Progress Note  Patient ID: Lisa Patterson, MRN: [id number],    Date: 09/10/2022  Time Spent: 4:00pm-4:55pm   55 minutes   Treatment Type: Individual Therapy  Reported Symptoms: anxiety  Mental Status Exam: Appearance:  Casual and Well Groomed     Behavior: Appropriate  Motor: Normal  Speech/Language:  Normal Rate  Affect: Appropriate  Mood: normal  Thought process: normal  Thought content:   WNL  Sensory/Perceptual disturbances:   WNL  Orientation: oriented to person, place, time/date, and situation  Attention: Good  Concentration: Good  Memory: WNL  Fund of knowledge:  Good  Insight:   Good  Judgment:  Good  Impulse Control: Good   Risk Assessment: Danger to Self:  No Self-injurious Behavior: No Danger to Others: No Duty to Warn:no Physical Aggression / Violence:No  Access to Firearms a concern: No  Gang Involvement:No   Subjective: Pt present for face-to-face individual therapy via video.  Pt consents to telehealth video session and is aware of limitations of virtual sessions. Location of pt: home Location of therapist: home office.  Pt talked about having symptoms of peri menapause.  She has had hot flashes and mood swings.  Addressed what pt has been experiencing and worked on Pharmacologist.  Pt talked about continuing to look for a house to buy.  She is frustrated with how expensive the houses are and she is not able to find a house that she likes in her price range.  Addressed pt's frustrations.   Pt talked about work.  There have been some new staff.  The new staff are working out well.  But some of the current staff do not do their jobs efficiently which can be frustrating.   Worked on how pt can increase self care.  Provided supportive therapy.      Interventions: Cognitive Behavioral Therapy and Insight-Oriented  Diagnosis: F43.23  Plan of Care: Recommend ongoing therapy.   Pt participated in setting  treatment goals.    Pt wants to learn how to manage situations that will keep her happy and others happy too. Pt wants to improve coping skills and self esteem.   Plan to meet every two weeks.    Treatment Plan (treatment plan target date: 10/25/2022) Client Abilities/Strengths  Pt is bright, engaging, and motivated for therapy.  Client Treatment Preferences  Individual therapy.  Client Statement of Needs  Improve copings skills and understand herself better. Improve self esteem.  Symptoms  Depressed or irritable mood. Excessive and/or unrealistic worry that is difficult to control occurring more days than not for at least 6 months about a number of events or activities. Hypervigilance (e.g., feeling constantly on edge, experiencing concentration difficulties, having trouble falling or staying asleep, exhibiting a general state of irritability). Low self-esteem. Problems Addressed  Unipolar Depression, Anxiety Goals 1. Alleviate depressive symptoms and return to previous level of effective functioning. 2. Appropriately grieve the loss in order to normalize mood and to return to previously adaptive level of functioning. Objective Learn and implement behavioral strategies to overcome depression. Target Date: 2022-10-25 Frequency: Biweekly  Progress: 10 Modality: individual  Related Interventions Assist the client in developing skills that increase the likelihood of deriving pleasure from behavioral activation (e.g., assertiveness skills, developing an exercise plan, less internal/more external focus, increased social involvement); reinforce success. Engage the client in "behavioral activation," increasing his/her activity level and contact with sources of reward, while identifying processes that inhibit activation. use behavioral techniques such as instruction, rehearsal, role-playing,  role reversal, as needed, to facilitate activity in the client's daily life; reinforce success. 3. Develop  healthy interpersonal relationships that lead to the alleviation and help prevent the relapse of depression. 4. Develop healthy thinking patterns and beliefs about self, others, and the world that lead to the alleviation and help prevent the relapse of depression. 5. Enhance ability to effectively cope with the full variety of life's worries and anxieties. 6. Learn and implement coping skills that result in a reduction of anxiety and worry, and improved daily functioning. Objective Learn and implement problem-solving strategies for realistically addressing worries. Target Date: 2022-10-25 Frequency: Biweekly  Progress: 10 Modality: individual  Related Interventions Assign the client a homework exercise in which he/she problem-solves a current problem (see Mastery of Your Anxiety and Worry: Workbook by Elenora Fender and Filbert Schilder or Generalized Anxiety Disorder by Elesa Hacker, and Filbert Schilder); review, reinforce success, and provide corrective feedback toward improvement. Teach the client problem-solving strategies involving specifically defining a problem, generating options for addressing it, evaluating the pros and cons of each option, selecting and implementing an optional action, and reevaluating and refining the action. Objective Learn and implement calming skills to reduce overall anxiety and manage anxiety symptoms. Target Date: 2022-10-25 Frequency: Biweekly  Progress: 10 Modality: individual  Related Interventions Assign the client to read about progressive muscle relaxation and other calming strategies in relevant books or treatment manuals (e.g., Progressive Relaxation Training by Twana First; Mastery of Your Anxiety and Worry: Workbook by Earlie Counts). Assign the client homework each session in which he/she practices relaxation exercises daily, gradually applying them progressively from non-anxiety-provoking to anxiety-provoking situations; review and reinforce success while  providing corrective feedback toward improvement. Teach the client calming/relaxation skills (e.g., applied relaxation, progressive muscle relaxation, cue controlled relaxation; mindful breathing; biofeedback) and how to discriminate better between relaxation and tension; teach the client how to apply these skills to his/her daily life. 7. Recognize, accept, and cope with feelings of depression. 8. Reduce overall frequency, intensity, and duration of the anxiety so that daily functioning is not impaired. 9. Resolve the core conflict that is the source of anxiety. 10. Stabilize anxiety level while increasing ability to function on a daily basis. Diagnosis F43.23  Conditions For Discharge Achievement of treatment goals and objectives   Salomon Fick, LCSW

## 2022-09-13 IMAGING — MG MM DIGITAL SCREENING BILAT W/ TOMO AND CAD
6 of 10 series · 6 of 30 positions shown · non-contrast
Comparison: Previous exam(s).

CLINICAL DATA: Screening.

EXAM:
DIGITAL SCREENING BILATERAL MAMMOGRAM WITH TOMOSYNTHESIS AND CAD
TECHNIQUE: Bilateral screening digital craniocaudal and mediolateral oblique
mammograms were obtained. Bilateral screening digital breast
tomosynthesis was performed. The images were evaluated with
computer-aided detection.

[L CC synth-2D]
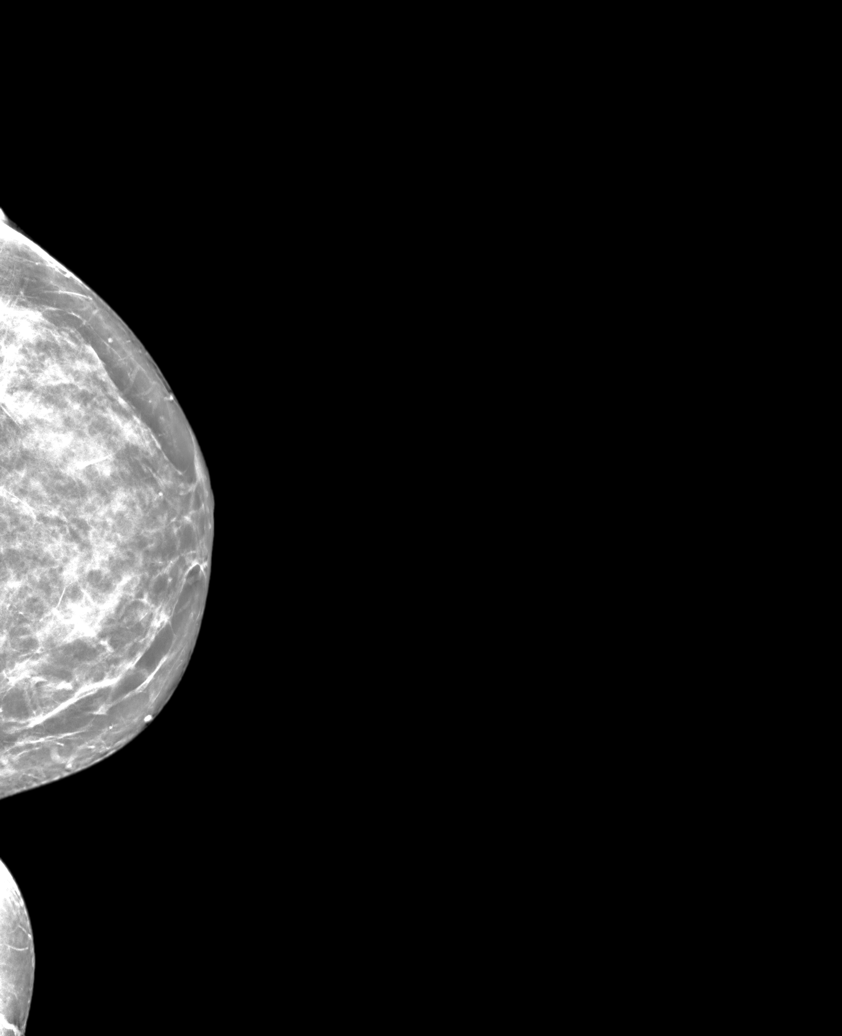

[R MLO synth-2D]
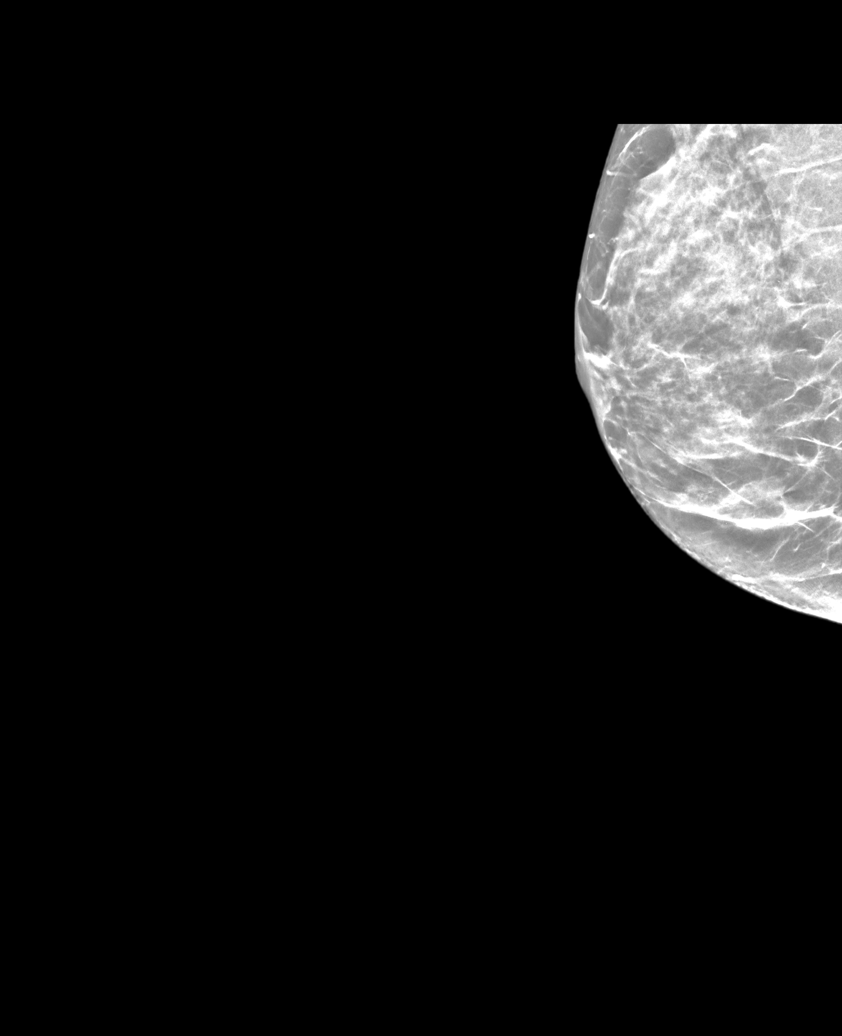

[R XCCL synth-2D]
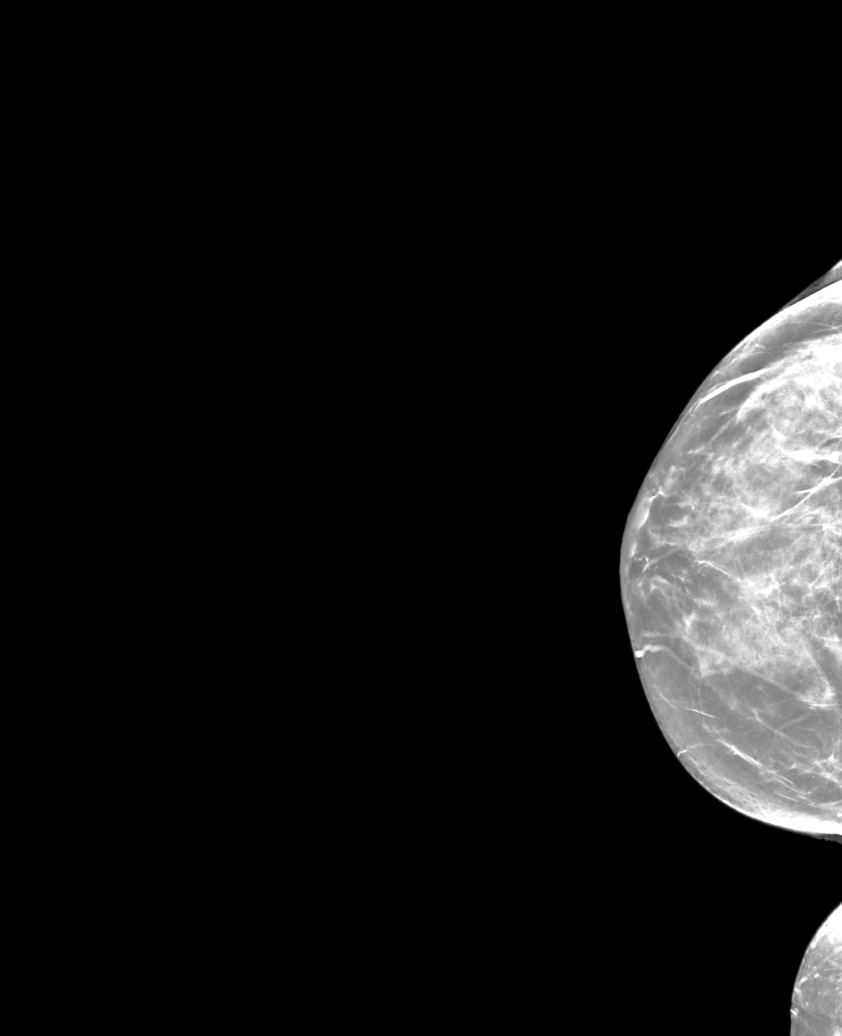

[L MLO synth-2D]
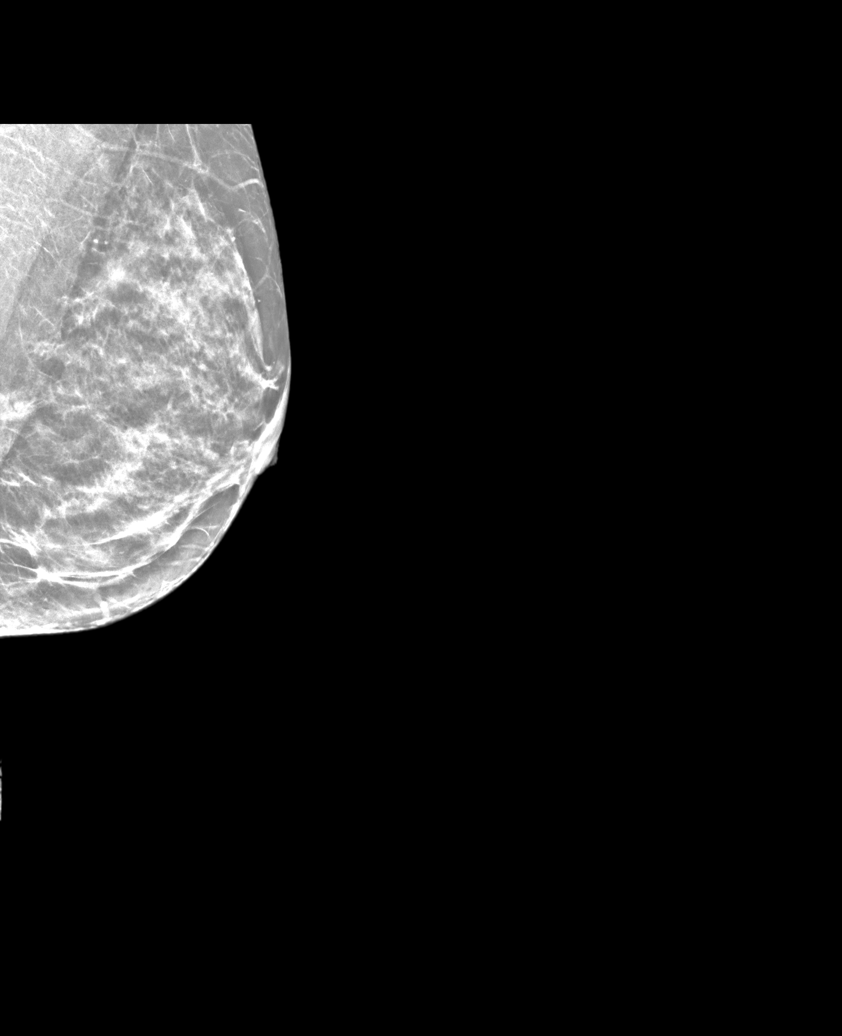

[R CC synth-2D]
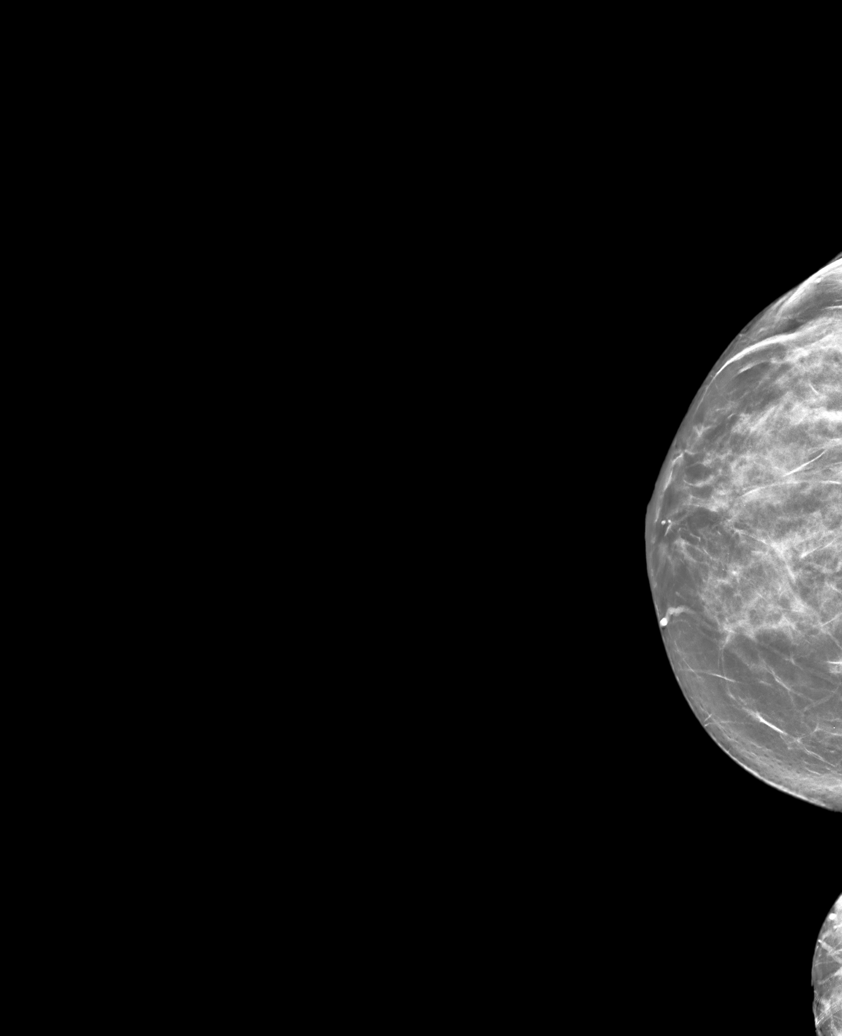

[L MLO tomo · tomo slice 27/52.0]
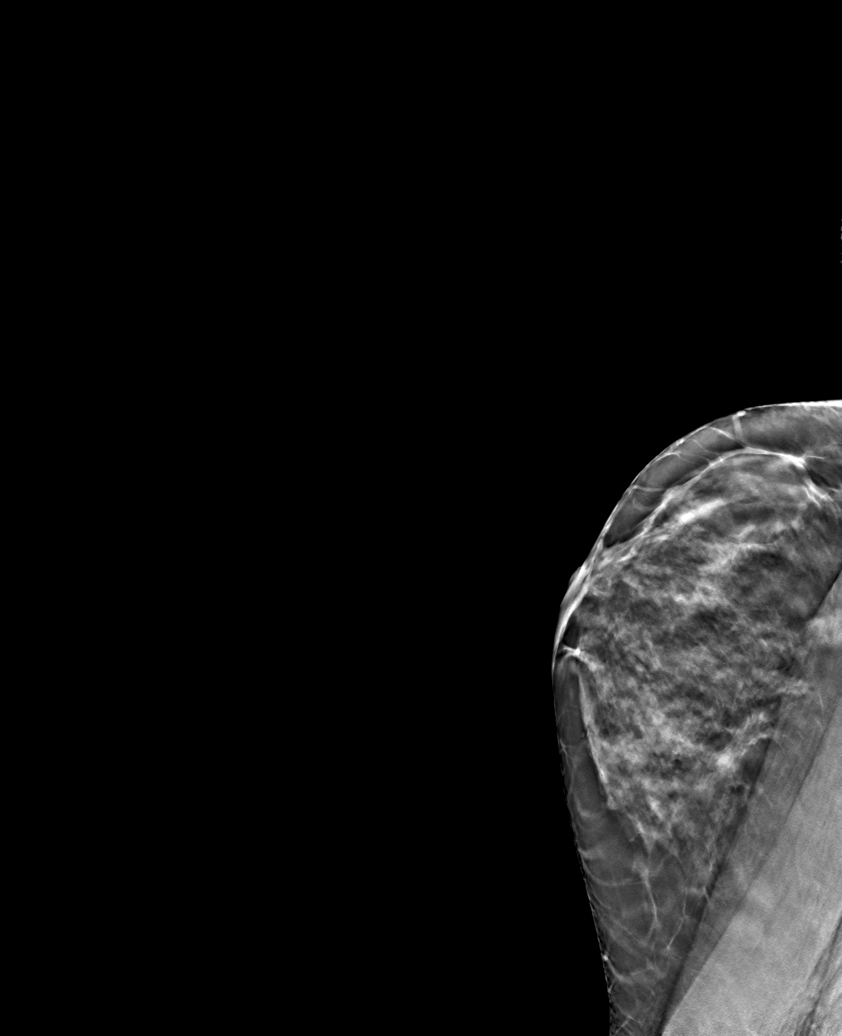

[6 of 30 positions shown; findings below may reference images not displayed]

ACR Breast Density Category c: The breast tissue is heterogeneously
dense, which may obscure small masses.
FINDINGS: There are no findings suspicious for malignancy.
IMPRESSION: No mammographic evidence of malignancy. A result letter of this
screening mammogram will be mailed directly to the patient.

RECOMMENDATION:
Screening mammogram in one year. (Code:Q3-W-BC3)

BI-RADS CATEGORY  1: Negative.

## 2022-09-14 ENCOUNTER — Encounter: Payer: Self-pay | Admitting: Family Medicine

## 2022-09-14 ENCOUNTER — Ambulatory Visit: Payer: Commercial Managed Care - PPO | Admitting: Family Medicine

## 2022-09-14 ENCOUNTER — Other Ambulatory Visit (HOSPITAL_BASED_OUTPATIENT_CLINIC_OR_DEPARTMENT_OTHER): Payer: Self-pay

## 2022-09-14 VITALS — BP 118/78 | HR 80 | Temp 98.1°F | Resp 17 | Ht 61.0 in | Wt 127.5 lb

## 2022-09-14 DIAGNOSIS — I10 Essential (primary) hypertension: Secondary | ICD-10-CM | POA: Diagnosis not present

## 2022-09-14 DIAGNOSIS — E785 Hyperlipidemia, unspecified: Secondary | ICD-10-CM | POA: Diagnosis not present

## 2022-09-14 LAB — BASIC METABOLIC PANEL
BUN: 11 mg/dL (ref 6–23)
CO2: 26 mEq/L (ref 19–32)
Calcium: 9.3 mg/dL (ref 8.4–10.5)
Chloride: 105 mEq/L (ref 96–112)
Creatinine, Ser: 0.88 mg/dL (ref 0.40–1.20)
GFR: 76.97 mL/min (ref >60.00)
Glucose, Bld: 97 mg/dL (ref 70–99)
Potassium: 4.3 mEq/L (ref 3.5–5.1)
Sodium: 137 mEq/L (ref 135–145)

## 2022-09-14 LAB — LIPID PANEL
Cholesterol: 241 mg/dL — ABNORMAL HIGH (ref 0–200)
HDL: 70.2 mg/dL (ref >39.00)
LDL Cholesterol: 158 mg/dL — ABNORMAL HIGH (ref 0–99)
NonHDL: 170.71
Total CHOL/HDL Ratio: 3
Triglycerides: 64 mg/dL (ref 0.0–149.0)
VLDL: 12.8 mg/dL (ref 0.0–40.0)

## 2022-09-14 LAB — HEPATIC FUNCTION PANEL
ALT: 11 U/L (ref 0–35)
AST: 18 U/L (ref 0–37)
Albumin: 4.3 g/dL (ref 3.5–5.2)
Alkaline Phosphatase: 90 U/L (ref 39–117)
Bilirubin, Direct: 0.3 mg/dL (ref 0.0–0.3)
Total Bilirubin: 1.9 mg/dL — ABNORMAL HIGH (ref 0.2–1.2)
Total Protein: 7.3 g/dL (ref 6.0–8.3)

## 2022-09-14 LAB — CBC WITH DIFFERENTIAL/PLATELET
Basophils Absolute: 0 10*3/uL (ref 0.0–0.1)
Basophils Relative: 0.6 % (ref 0.0–3.0)
Eosinophils Absolute: 0 10*3/uL (ref 0.0–0.7)
Eosinophils Relative: 0.6 % (ref 0.0–5.0)
HCT: 37.6 % (ref 36.0–46.0)
Hemoglobin: 12.1 g/dL (ref 12.0–15.0)
Lymphocytes Relative: 35.8 % (ref 12.0–46.0)
Lymphs Abs: 1.4 10*3/uL (ref 0.7–4.0)
MCHC: 32.1 g/dL (ref 30.0–36.0)
MCV: 84.9 fl (ref 78.0–100.0)
Monocytes Absolute: 0.4 10*3/uL (ref 0.1–1.0)
Monocytes Relative: 11.5 % (ref 3.0–12.0)
Neutro Abs: 2 10*3/uL (ref 1.4–7.7)
Neutrophils Relative %: 51.5 % (ref 43.0–77.0)
Platelets: 288 10*3/uL (ref 150.0–400.0)
RBC: 4.43 Mil/uL (ref 3.87–5.11)
RDW: 14.8 % (ref 11.5–15.5)
WBC: 3.8 10*3/uL — ABNORMAL LOW (ref 4.0–10.5)

## 2022-09-14 LAB — TSH: TSH: 1.68 u[IU]/mL (ref 0.35–5.50)

## 2022-09-14 MED ORDER — AMLODIPINE BESYLATE 5 MG PO TABS
5.0000 mg | ORAL_TABLET | Freq: Every day | ORAL | 11 refills | Status: DC
Start: 2022-09-14 — End: 2023-09-09
  Filled 2022-09-14: qty 90, 90d supply, fill #0
  Filled 2022-12-14: qty 90, 90d supply, fill #1
  Filled 2023-03-13: qty 90, 90d supply, fill #2
  Filled 2023-06-11: qty 90, 90d supply, fill #3

## 2022-09-14 MED ORDER — OMEPRAZOLE 40 MG PO CPDR
40.0000 mg | DELAYED_RELEASE_CAPSULE | Freq: Every day | ORAL | 1 refills | Status: DC
Start: 2022-09-14 — End: 2023-10-07
  Filled 2022-09-14: qty 90, 90d supply, fill #0

## 2022-09-14 MED ORDER — METOPROLOL SUCCINATE ER 25 MG PO TB24
25.0000 mg | ORAL_TABLET | Freq: Every day | ORAL | 11 refills | Status: DC
  Filled 2022-09-14: qty 90, 90d supply, fill #0
  Filled 2022-12-14: qty 90, 90d supply, fill #1
  Filled 2023-03-13: qty 90, 90d supply, fill #2
  Filled 2023-06-11: qty 90, 90d supply, fill #3

## 2022-09-14 NOTE — Assessment & Plan Note (Signed)
Chronic problem.  Well controlled on Amlodipine 5mg  daily and Metoprolol XL 25mg  daily.  Currently asymptomatic.  Will follow.

## 2022-09-14 NOTE — Assessment & Plan Note (Signed)
Chronic problem.  Attempting to control w/ diet and exercise.  Last LDL 138.  Check labs and determine if medication is needed

## 2022-09-14 NOTE — Patient Instructions (Signed)
Schedule your complete physical in 6 months We'll notify you of your lab results and make any changes if needed Keep up the good work!  You look great! Call with any questions or concerns Have a great summer!!! 

## 2022-09-14 NOTE — Progress Notes (Signed)
   Subjective:    Patient ID: Lisa Patterson, female    DOB: [birth year], 52 y.o.   MRN: [id number]  HPI HTN- chronic problem, on Amlodipine 5mg  daily and Metoprolol XL 25mg  daily w/ good control.  Denies CP, SOB, HA's, visual changes, edema  Hyperlipidemia- chronic problem, last LDL 138.  Attempting to control w/ diet and exercise.  No abd pain, N/V.   Review of Systems For ROS see HPI     Objective:   Physical Exam Vitals reviewed.  Constitutional:      General: She is not in acute distress.    Appearance: Normal appearance. She is well-developed. She is not ill-appearing.  HENT:     Head: Normocephalic and atraumatic.  Eyes:     Conjunctiva/sclera: Conjunctivae normal.     Pupils: Pupils are equal, round, and reactive to light.  Neck:     Thyroid: No thyromegaly.  Cardiovascular:     Rate and Rhythm: Normal rate and regular rhythm.     Pulses: Normal pulses.     Heart sounds: Normal heart sounds. No murmur heard. Pulmonary:     Effort: Pulmonary effort is normal. No respiratory distress.     Breath sounds: Normal breath sounds.  Abdominal:     General: There is no distension.     Palpations: Abdomen is soft.     Tenderness: There is no abdominal tenderness.  Musculoskeletal:     Cervical back: Normal range of motion and neck supple.     Right lower leg: No edema.     Left lower leg: No edema.  Lymphadenopathy:     Cervical: No cervical adenopathy.  Skin:    General: Skin is warm and dry.  Neurological:     Mental Status: She is alert and oriented to person, place, and time.  Psychiatric:        Behavior: Behavior normal.           Assessment & Plan:

## 2022-09-17 ENCOUNTER — Telehealth: Payer: Self-pay

## 2022-09-17 NOTE — Telephone Encounter (Signed)
-----   Message from Neena Rhymes sent at 09/17/2022  7:32 AM EDT ----- Labs look good w/ exception of total cholesterol and LDL (bad cholesterol).  We have 2 choices- we can start a cholesterol medication to improve these numbers, or we can wait and see in 6 months (b/c the ratio of bad cholesterol to good cholesterol is still excellent at 3).  Just let me know how you want to proceed.

## 2022-09-17 NOTE — Telephone Encounter (Signed)
I spoke with patient and let her know the lab results. She states that at this time she wants to wait the 6 months to recheck and then discuss further at that time.

## 2022-09-17 NOTE — Telephone Encounter (Signed)
Left pt a VM to return my cal in regards to her labs

## 2022-10-11 ENCOUNTER — Ambulatory Visit: Payer: Commercial Managed Care - PPO | Admitting: Psychology

## 2022-11-13 ENCOUNTER — Ambulatory Visit (INDEPENDENT_AMBULATORY_CARE_PROVIDER_SITE_OTHER): Payer: Commercial Managed Care - PPO | Admitting: Psychology

## 2022-11-13 DIAGNOSIS — F4323 Adjustment disorder with mixed anxiety and depressed mood: Secondary | ICD-10-CM
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
## 2022-12-10 ENCOUNTER — Ambulatory Visit (INDEPENDENT_AMBULATORY_CARE_PROVIDER_SITE_OTHER): Payer: Commercial Managed Care - PPO | Admitting: Family Medicine

## 2022-12-10 ENCOUNTER — Encounter: Payer: Self-pay | Admitting: Family Medicine

## 2022-12-10 VITALS — BP 121/82 | HR 83 | Temp 98.0°F | Ht 61.0 in | Wt 124.0 lb

## 2022-12-10 DIAGNOSIS — R55 Syncope and collapse: Secondary | ICD-10-CM

## 2022-12-10 NOTE — Patient Instructions (Signed)
Your EKG is normal.   Stay hydrated.  Let us know if you need anything.

## 2022-12-10 NOTE — Progress Notes (Signed)
Chief Complaint  Patient presents with   feels like she may pass out after working out.     Subjective: Patient is a 52 y.o. female here for near syncope.  Has gone to the gym 3 times where she was doing boot camp style HIIT work outs and had near syncope where she almost passed out. Started 2 mo ago. Her BP has been fine. Does not have issues outside of these exercise activities and only during particularly strenuous activities. No recent illness. Eating/drinking normally. No correlation with eating/not eating and then exercising with her symptoms. No amhx of cardiac death <18 yo. No N/V, bleeding, or high stress levels. Will sometimes have diarrhea after episodes.   Past Medical History:  Diagnosis Date   Hypertension    Hypertension since 2014    Objective: BP 121/82 (BP Location: Left Arm, Patient Position: Sitting, Cuff Size: Normal)   Pulse 83   Temp 98 F (36.7 C) (Oral)   Ht 5\' 1"  (1.549 m)   Wt 124 lb (56.2 kg)   SpO2 95%   BMI 23.43 kg/m  General: Awake, appears stated age Heart: RRR, no bruits Mouth: MMM Lungs: CTAB, no rales, wheezes or rhonchi. No accessory muscle use Psych: Age appropriate judgment and insight, normal affect and mood  Assessment and Plan: Near syncope - Plan: EKG 12-Lead, ECHOCARDIOGRAM COMPLETE  Will ck echo to r/o structural abn. With a/w exertion, arrhythmia seems less likely. EKG shows NSR, normal axis, no interval abnormalities, no ST segment or T wave changes, good R wave progression. Would consider cards referral if echo neg and it happens again. Stay hydrated in meanwhile.  The patient voiced understanding and agreement to the plan.  Jilda Roche [place], DO 12/10/22  12:05 PM

## 2022-12-11 ENCOUNTER — Ambulatory Visit: Payer: Commercial Managed Care - PPO | Admitting: Psychology

## 2022-12-14 ENCOUNTER — Other Ambulatory Visit: Payer: Self-pay

## 2022-12-20 ENCOUNTER — Other Ambulatory Visit (HOSPITAL_BASED_OUTPATIENT_CLINIC_OR_DEPARTMENT_OTHER): Payer: Self-pay

## 2022-12-21 ENCOUNTER — Ambulatory Visit: Payer: Commercial Managed Care - PPO | Attending: Family Medicine

## 2022-12-21 DIAGNOSIS — R55 Syncope and collapse: Secondary | ICD-10-CM

## 2022-12-21 LAB — ECHOCARDIOGRAM COMPLETE
Calc EF: 72.2 %
S' Lateral: 2.8 cm
Single Plane A2C EF: 73.7 %
Single Plane A4C EF: 70.7 %

## 2023-01-15 ENCOUNTER — Ambulatory Visit: Payer: Commercial Managed Care - PPO | Admitting: Psychology

## 2023-01-15 DIAGNOSIS — F4323 Adjustment disorder with mixed anxiety and depressed mood: Secondary | ICD-10-CM

## 2023-01-15 NOTE — Progress Notes (Signed)
[facility] Counselor/Therapist Progress Note  Patient ID: Lisa Patterson, MRN: [id number],    Date: 01/15/2023  Time Spent: 4:00pm-4:55pm   55 minutes   Treatment Type: Individual Therapy  Reported Symptoms: stress  Mental Status Exam: Appearance:  Casual     Behavior: Appropriate  Motor: Normal  Speech/Language:  Normal Rate  Affect: Appropriate  Mood: normal  Thought process: normal  Thought content:   WNL  Sensory/Perceptual disturbances:   WNL  Orientation: oriented to person, place, time/date, and situation  Attention: Good  Concentration: Good  Memory: WNL  Fund of knowledge:  Good  Insight:   Good  Judgment:  Good  Impulse Control: Good   Risk Assessment: Danger to Self:  No Self-injurious Behavior: No Danger to Others: No Duty to Warn:no Physical Aggression / Violence:No  Access to Firearms a concern: No  Gang Involvement:No   Subjective: Pt present for face-to-face individual therapy via video.  Pt consents to telehealth video session and is aware of limitations and benefits of virtual sessions.  Location of pt: home Location of therapist: home office.   Pt talked about her new home.  She is very happy in her new home and is glad to have a yard and nice neighborhood.  Her neighborhood is quiet and the neighbors are nice.  Pt does not like the 45 minute commute to work so she is looking for jobs closer to her new home. Pt states her anxiety has improved and her sleep is better.   Pt is working on not being as hard on herself.   Worked on additional ways to be self compassionate.  Pt talked about her current job.  There is a lot of job stress bc of work dynamics.   The staff have not been getting along and there are a lot of changes coming up.   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  Worked on self care strategies. Provided supportive therapy.  Interventions: Cognitive Behavioral Therapy and Insight-Oriented  Diagnosis:   F43.23  Plan of Care: Recommend ongoing therapy.   Pt participated in setting treatment goals.    Pt wants to learn how to manage situations that will keep her happy and others happy too. Pt wants to improve coping skills and self esteem.   Plan to meet monthly.    Pt agrees with treatment plan.    Treatment Plan (treatment plan target date: 11/13/2023) Client Abilities/Strengths  Pt is bright, engaging, and motivated for therapy.  Client Treatment Preferences  Individual therapy.  Client Statement of Needs  Improve copings skills. Improve self esteem.  Symptoms  Depressed or irritable mood. Excessive and/or unrealistic worry that is difficult to control occurring more days than not for at least 6 months about a number of events or activities. Hypervigilance (e.g., feeling constantly on edge, experiencing concentration difficulties, having trouble falling or staying asleep, exhibiting a general state of irritability). Low self-esteem. Problems Addressed  Unipolar Depression, Anxiety Goals 1. Alleviate depressive symptoms and return to previous level of effective functioning. 2. Appropriately grieve the loss in order to normalize mood and to return to previously adaptive level of functioning. Objective Learn and implement behavioral strategies to overcome depression. Target Date: 2023-11-13 Frequency: Monthly  Progress: 30 Modality: individual  Related Interventions Assist the client in developing skills that increase the likelihood of deriving pleasure from behavioral activation (e.g., assertiveness skills, developing an exercise plan, less internal/more external focus, increased social involvement); reinforce success. Engage the client in "behavioral activation,"  increasing his/her activity level and contact with sources of reward, while identifying processes that inhibit activation. use behavioral techniques such as instruction, rehearsal, role-playing, role reversal, as needed, to  facilitate activity in the client's daily life; reinforce success. 3. Develop healthy interpersonal relationships that lead to the alleviation and help prevent the relapse of depression. 4. Develop healthy thinking patterns and beliefs about self, others, and the world that lead to the alleviation and help prevent the relapse of depression. 5. Enhance ability to effectively cope with the full variety of life's worries and anxieties. 6. Learn and implement coping skills that result in a reduction of anxiety and worry, and improved daily functioning. Objective Learn and implement problem-solving strategies for realistically addressing worries. Target Date: 2023-11-13 Frequency: Monthly  Progress: 30 Modality: individual  Related Interventions Assign the client a homework exercise in which he/she problem-solves a current problem (see Mastery of Your Anxiety and Worry: Workbook by Elenora Fender and Filbert Schilder or Generalized Anxiety Disorder by Elesa Hacker, and Filbert Schilder); review, reinforce success, and provide corrective feedback toward improvement. Teach the client problem-solving strategies involving specifically defining a problem, generating options for addressing it, evaluating the pros and cons of each option, selecting and implementing an optional action, and reevaluating and refining the action. Objective Learn and implement calming skills to reduce overall anxiety and manage anxiety symptoms. Target Date: 2023-11-13 Frequency: Monthly  Progress: 30 Modality: individual  Related Interventions Assign the client to read about progressive muscle relaxation and other calming strategies in relevant books or treatment manuals (e.g., Progressive Relaxation Training by Twana First; Mastery of Your Anxiety and Worry: Workbook by Earlie Counts). Assign the client homework each session in which he/she practices relaxation exercises daily, gradually applying them progressively from  non-anxiety-provoking to anxiety-provoking situations; review and reinforce success while providing corrective feedback toward improvement. Teach the client calming/relaxation skills (e.g., applied relaxation, progressive muscle relaxation, cue controlled relaxation; mindful breathing; biofeedback) and how to discriminate better between relaxation and tension; teach the client how to apply these skills to his/her daily life. 7. Recognize, accept, and cope with feelings of depression. 8. Reduce overall frequency, intensity, and duration of the anxiety so that daily functioning is not impaired. 9. Resolve the core conflict that is the source of anxiety. 10. Stabilize anxiety level while increasing ability to function on a daily basis. Diagnosis F43.23  Conditions For Discharge Achievement of treatment goals and objectives   Salomon Fick, LCSW

## 2023-01-29 ENCOUNTER — Other Ambulatory Visit: Payer: Self-pay | Admitting: Family Medicine

## 2023-01-29 DIAGNOSIS — B001 Herpesviral vesicular dermatitis: Secondary | ICD-10-CM

## 2023-01-29 MED ORDER — VALACYCLOVIR HCL 1 G PO TABS
1000.0000 mg | ORAL_TABLET | Freq: Three times a day (TID) | ORAL | 2 refills | Status: AC
  Filled 2023-01-29: qty 30, 10d supply, fill #0

## 2023-01-30 ENCOUNTER — Other Ambulatory Visit (HOSPITAL_BASED_OUTPATIENT_CLINIC_OR_DEPARTMENT_OTHER): Payer: Self-pay

## 2023-02-12 ENCOUNTER — Ambulatory Visit: Payer: Commercial Managed Care - PPO | Admitting: Psychology

## 2023-02-12 DIAGNOSIS — F4323 Adjustment disorder with mixed anxiety and depressed mood: Secondary | ICD-10-CM

## 2023-02-12 NOTE — Progress Notes (Signed)
[facility] Counselor/Therapist Progress Note  Patient ID: Lisa Patterson, MRN: [id number],    Date: 02/12/2023  Time Spent: 4:00pm-4:55pm   55 minutes   Treatment Type: Individual Therapy  Reported Symptoms: stress  Mental Status Exam: Appearance:  Casual     Behavior: Appropriate  Motor: Normal  Speech/Language:  Normal Rate  Affect: Appropriate  Mood: normal  Thought process: normal  Thought content:   WNL  Sensory/Perceptual disturbances:   WNL  Orientation: oriented to person, place, time/date, and situation  Attention: Good  Concentration: Good  Memory: WNL  Fund of knowledge:  Good  Insight:   Good  Judgment:  Good  Impulse Control: Good   Risk Assessment: Danger to Self:  No Self-injurious Behavior: No Danger to Others: No Duty to Warn:no Physical Aggression / Violence:No  Access to Firearms a concern: No  Gang Involvement:No   Subjective: Pt present for face-to-face individual therapy via video.  Pt consents to telehealth video session and is aware of limitations and benefits of virtual sessions.  Location of pt: home Location of therapist: home office.   Pt talked about having an interview for a new job.   Pt was offered a job but did not feel like it was a good fit.  She is looking for other jobs.  Pt talked about her current job.  There is a lot of job stress bc of work dynamics.   Pt is anxious about the changes coming up.    Her increased anxiety is creating GI issues for her.  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   Worked on calming strategies.   Pt talked about her mother.   Her mother has been easier to deal with lately which is a relief for pt.  Pt's mother has not been as critical of pt as well.   Worked on self care strategies. Provided supportive therapy.  Interventions: Cognitive Behavioral Therapy and Insight-Oriented  Diagnosis:  F43.23  Plan of Care: Recommend ongoing therapy.   Pt participated in  setting treatment goals.    Pt wants to learn how to manage situations that will keep her happy and others happy too. Pt wants to improve coping skills and self esteem.   Plan to meet monthly.    Pt agrees with treatment plan.    Treatment Plan (treatment plan target date: 11/13/2023) Client Abilities/Strengths  Pt is bright, engaging, and motivated for therapy.  Client Treatment Preferences  Individual therapy.  Client Statement of Needs  Improve copings skills. Improve self esteem.  Symptoms  Depressed or irritable mood. Excessive and/or unrealistic worry that is difficult to control occurring more days than not for at least 6 months about a number of events or activities. Hypervigilance (e.g., feeling constantly on edge, experiencing concentration difficulties, having trouble falling or staying asleep, exhibiting a general state of irritability). Low self-esteem. Problems Addressed  Unipolar Depression, Anxiety Goals 1. Alleviate depressive symptoms and return to previous level of effective functioning. 2. Appropriately grieve the loss in order to normalize mood and to return to previously adaptive level of functioning. Objective Learn and implement behavioral strategies to overcome depression. Target Date: 2023-11-13 Frequency: Monthly  Progress: 30 Modality: individual  Related Interventions Assist the client in developing skills that increase the likelihood of deriving pleasure from behavioral activation (e.g., assertiveness skills, developing an exercise plan, less internal/more external focus, increased social involvement); reinforce success. Engage the client in "behavioral activation," increasing his/her activity level and contact with sources of reward,  while identifying processes that inhibit activation. use behavioral techniques such as instruction, rehearsal, role-playing, role reversal, as needed, to facilitate activity in the client's daily life; reinforce success. 3. Develop  healthy interpersonal relationships that lead to the alleviation and help prevent the relapse of depression. 4. Develop healthy thinking patterns and beliefs about self, others, and the world that lead to the alleviation and help prevent the relapse of depression. 5. Enhance ability to effectively cope with the full variety of life's worries and anxieties. 6. Learn and implement coping skills that result in a reduction of anxiety and worry, and improved daily functioning. Objective Learn and implement problem-solving strategies for realistically addressing worries. Target Date: 2023-11-13 Frequency: Monthly  Progress: 30 Modality: individual  Related Interventions Assign the client a homework exercise in which he/she problem-solves a current problem (see Mastery of Your Anxiety and Worry: Workbook by Elenora Fender and Filbert Schilder or Generalized Anxiety Disorder by Elesa Hacker, and Filbert Schilder); review, reinforce success, and provide corrective feedback toward improvement. Teach the client problem-solving strategies involving specifically defining a problem, generating options for addressing it, evaluating the pros and cons of each option, selecting and implementing an optional action, and reevaluating and refining the action. Objective Learn and implement calming skills to reduce overall anxiety and manage anxiety symptoms. Target Date: 2023-11-13 Frequency: Monthly  Progress: 30 Modality: individual  Related Interventions Assign the client to read about progressive muscle relaxation and other calming strategies in relevant books or treatment manuals (e.g., Progressive Relaxation Training by Twana First; Mastery of Your Anxiety and Worry: Workbook by Earlie Counts). Assign the client homework each session in which he/she practices relaxation exercises daily, gradually applying them progressively from non-anxiety-provoking to anxiety-provoking situations; review and reinforce success while providing  corrective feedback toward improvement. Teach the client calming/relaxation skills (e.g., applied relaxation, progressive muscle relaxation, cue controlled relaxation; mindful breathing; biofeedback) and how to discriminate better between relaxation and tension; teach the client how to apply these skills to his/her daily life. 7. Recognize, accept, and cope with feelings of depression. 8. Reduce overall frequency, intensity, and duration of the anxiety so that daily functioning is not impaired. 9. Resolve the core conflict that is the source of anxiety. 10. Stabilize anxiety level while increasing ability to function on a daily basis. Diagnosis F43.23  Conditions For Discharge Achievement of treatment goals and objectives   Salomon Fick, LCSW

## 2023-03-22 ENCOUNTER — Encounter: Payer: Self-pay | Admitting: Family Medicine

## 2023-03-22 ENCOUNTER — Other Ambulatory Visit: Payer: Self-pay

## 2023-03-22 ENCOUNTER — Telehealth: Payer: Self-pay

## 2023-03-22 ENCOUNTER — Other Ambulatory Visit (HOSPITAL_BASED_OUTPATIENT_CLINIC_OR_DEPARTMENT_OTHER): Payer: Self-pay

## 2023-03-22 ENCOUNTER — Ambulatory Visit (INDEPENDENT_AMBULATORY_CARE_PROVIDER_SITE_OTHER): Payer: Commercial Managed Care - PPO | Admitting: Family Medicine

## 2023-03-22 VITALS — BP 117/72 | HR 89 | Temp 98.2°F | Ht 61.0 in | Wt 126.4 lb

## 2023-03-22 DIAGNOSIS — Z114 Encounter for screening for human immunodeficiency virus [HIV]: Secondary | ICD-10-CM

## 2023-03-22 DIAGNOSIS — Z0000 Encounter for general adult medical examination without abnormal findings: Secondary | ICD-10-CM | POA: Diagnosis not present

## 2023-03-22 DIAGNOSIS — Z1159 Encounter for screening for other viral diseases: Secondary | ICD-10-CM | POA: Diagnosis not present

## 2023-03-22 DIAGNOSIS — E559 Vitamin D deficiency, unspecified: Secondary | ICD-10-CM

## 2023-03-22 DIAGNOSIS — I10 Essential (primary) hypertension: Secondary | ICD-10-CM

## 2023-03-22 LAB — CBC WITH DIFFERENTIAL/PLATELET
Basophils Absolute: 0 10*3/uL (ref 0.0–0.1)
Basophils Relative: 0.9 % (ref 0.0–3.0)
Eosinophils Absolute: 0 10*3/uL (ref 0.0–0.7)
Eosinophils Relative: 0.5 % (ref 0.0–5.0)
HCT: 40.6 % (ref 36.0–46.0)
Hemoglobin: 13.1 g/dL (ref 12.0–15.0)
Lymphocytes Relative: 27.2 % (ref 12.0–46.0)
Lymphs Abs: 1 10*3/uL (ref 0.7–4.0)
MCHC: 32.4 g/dL (ref 30.0–36.0)
MCV: 88.7 fL (ref 78.0–100.0)
Monocytes Absolute: 0.3 10*3/uL (ref 0.1–1.0)
Monocytes Relative: 8.7 % (ref 3.0–12.0)
Neutro Abs: 2.3 10*3/uL (ref 1.4–7.7)
Neutrophils Relative %: 62.7 % (ref 43.0–77.0)
Platelets: 323 10*3/uL (ref 150.0–400.0)
RBC: 4.57 Mil/uL (ref 3.87–5.11)
RDW: 14.9 % (ref 11.5–15.5)
WBC: 3.7 10*3/uL — ABNORMAL LOW (ref 4.0–10.5)

## 2023-03-22 LAB — LIPID PANEL
Cholesterol: 249 mg/dL — ABNORMAL HIGH (ref 0–200)
HDL: 77 mg/dL (ref >39.00)
LDL Cholesterol: 153 mg/dL — ABNORMAL HIGH (ref 0–99)
NonHDL: 171.84
Total CHOL/HDL Ratio: 3
Triglycerides: 92 mg/dL (ref 0.0–149.0)
VLDL: 18.4 mg/dL (ref 0.0–40.0)

## 2023-03-22 LAB — HEPATIC FUNCTION PANEL
ALT: 8 U/L (ref 0–35)
AST: 14 U/L (ref 0–37)
Albumin: 4.6 g/dL (ref 3.5–5.2)
Alkaline Phosphatase: 96 U/L (ref 39–117)
Bilirubin, Direct: 0.2 mg/dL (ref 0.0–0.3)
Total Bilirubin: 1 mg/dL (ref 0.2–1.2)
Total Protein: 7.8 g/dL (ref 6.0–8.3)

## 2023-03-22 LAB — BASIC METABOLIC PANEL
BUN: 10 mg/dL (ref 6–23)
CO2: 27 meq/L (ref 19–32)
Calcium: 9.3 mg/dL (ref 8.4–10.5)
Chloride: 106 meq/L (ref 96–112)
Creatinine, Ser: 0.73 mg/dL (ref 0.40–1.20)
GFR: 95.97 mL/min (ref >60.00)
Glucose, Bld: 94 mg/dL (ref 70–99)
Potassium: 4.3 meq/L (ref 3.5–5.1)
Sodium: 140 meq/L (ref 135–145)

## 2023-03-22 LAB — VITAMIN D 25 HYDROXY (VIT D DEFICIENCY, FRACTURES): VITD: 11.28 ng/mL — ABNORMAL LOW (ref 30.00–100.00)

## 2023-03-22 LAB — TSH: TSH: 2.09 u[IU]/mL (ref 0.35–5.50)

## 2023-03-22 MED ORDER — VITAMIN D (ERGOCALCIFEROL) 1.25 MG (50000 UNIT) PO CAPS
50000.0000 [IU] | ORAL_CAPSULE | ORAL | 0 refills | Status: DC
  Filled 2023-03-22: qty 12, 84d supply, fill #0

## 2023-03-22 NOTE — Telephone Encounter (Signed)
-----   Message from Neena Rhymes sent at 03/22/2023  3:29 PM EST ----- Vit D is again low.  Based on this, we need to start 50,000 units weekly x12 weeks in addition to daily OTC supplement of at least 2000 units.  Your total total cholesterol and LDL (bad cholesterol) are both above goal but your HDL (good cholesterol) is excellent.  Please add OTC Red Yeast Rice daily (in the vitamin/supplement aisle) to lower your bad but keep the good.  Remainder of labs look great!

## 2023-03-22 NOTE — Patient Instructions (Signed)
Follow up in 6 months to recheck BP and cholesterol We'll notify you of your lab results and make any changes if needed Keep up the good work on healthy diet and regular exercise- you look great! Call with any questions or concerns Stay Safe!  Stay Healthy! Happy New Year!!! 

## 2023-03-22 NOTE — Assessment & Plan Note (Signed)
Check labs and replete prn. 

## 2023-03-22 NOTE — Progress Notes (Signed)
   Subjective:    Patient ID: Lisa Patterson, female    DOB: 05-20-1970, 53 y.o.   MRN: [id number]  HPI CPE- UTD on pap, mammo, Tdap, flu, colonoscopy.    Patient Care Team    Relationship Specialty Notifications Start End  Sheliah Hatch, MD PCP - General Family Medicine  12/30/13    Comment: Wilhemena Durie, Madelaine Etienne, MD Consulting Physician Obstetrics and Gynecology  10/15/16   [facility]    03/22/23      Health Maintenance  Topic Date Due   HIV Screening  Never done   Hepatitis C Screening  Never done   Cervical Cancer Screening (HPV/Pap Cotest)  12/31/2018   INFLUENZA VACCINE  10/04/2022   COVID-19 Vaccine (3 - 2024-25 season) 11/04/2022   Zoster Vaccines- Shingrix (1 of 2) Never done   MAMMOGRAM  07/23/2023   Colonoscopy  03/24/2031   DTaP/Tdap/Td (8 - Td or Tdap) 02/03/2032   HPV VACCINES  Aged Out      Review of Systems Patient reports no vision/ hearing changes, adenopathy,fever, weight change,  persistant/recurrent hoarseness , swallowing issues, chest pain, palpitations, edema, persistant/recurrent cough, hemoptysis, dyspnea (rest/exertional/paroxysmal nocturnal), gastrointestinal bleeding (melena, rectal bleeding), abdominal pain, significant heartburn, bowel changes, GU symptoms (dysuria, hematuria, incontinence), Gyn symptoms (abnormal  bleeding, pain),  syncope, focal weakness, memory loss, numbness & tingling, skin/hair/nail changes, abnormal bruising or bleeding, anxiety, or depression.     Objective:   Physical Exam General Appearance:    Alert, cooperative, no distress, appears stated age  Head:    Normocephalic, without obvious abnormality, atraumatic  Eyes:    PERRL, conjunctiva/corneas clear, EOM's intact both eyes  Ears:    Normal TM's and external ear canals, both ears  Nose:   Nares normal, septum midline, mucosa normal, no drainage    or sinus tenderness  Throat:   Lips, mucosa, and tongue normal; teeth and gums normal   Neck:   Supple, symmetrical, trachea midline, no adenopathy;    Thyroid: no enlargement/tenderness/nodules  Back:     Symmetric, no curvature, ROM normal, no CVA tenderness  Lungs:     Clear to auscultation bilaterally, respirations unlabored  Chest Wall:    No tenderness or deformity   Heart:    Regular rate and rhythm, S1 and S2 normal, no murmur, rub   or gallop  Breast Exam:    Deferred to GYN  Abdomen:     Soft, non-tender, bowel sounds active all four quadrants,    no masses, no organomegaly  Genitalia:    Deferred to GYN  Rectal:    Extremities:   Extremities normal, atraumatic, no cyanosis or edema  Pulses:   2+ and symmetric all extremities  Skin:   Skin color, texture, turgor normal, no rashes or lesions  Lymph nodes:   Cervical, supraclavicular, and axillary nodes normal  Neurologic:   CNII-XII intact, normal strength, sensation and reflexes    throughout          Assessment & Plan:

## 2023-03-22 NOTE — Telephone Encounter (Signed)
Vitamin D has been sent in to pharmacy Pt has reviewed labs via MyChart

## 2023-03-22 NOTE — Assessment & Plan Note (Signed)
Pt's PE WNL.  UTD on pap, mammo, colonoscopy, immunizations.  Check labs.  Anticipatory guidance provided.  

## 2023-03-23 LAB — HIV ANTIBODY (ROUTINE TESTING W REFLEX): HIV 1&2 Ab, 4th Generation: NONREACTIVE

## 2023-03-23 LAB — HEPATITIS C ANTIBODY: Hepatitis C Ab: NONREACTIVE

## 2023-03-28 ENCOUNTER — Encounter: Payer: Self-pay | Admitting: Family Medicine

## 2023-03-28 ENCOUNTER — Ambulatory Visit: Payer: Commercial Managed Care - PPO | Admitting: Psychology

## 2023-03-28 DIAGNOSIS — F4323 Adjustment disorder with mixed anxiety and depressed mood: Secondary | ICD-10-CM

## 2023-03-28 NOTE — Progress Notes (Signed)
[facility] Counselor/Therapist Progress Note  Patient ID: Girl Lorio, MRN: [id number],    Date: 03/28/2023  Time Spent: 5:00pm-5:55pm   55 minutes   Treatment Type: Individual Therapy  Reported Symptoms: stress  Mental Status Exam: Appearance:  Casual     Behavior: Appropriate  Motor: Normal  Speech/Language:  Normal Rate  Affect: Appropriate  Mood: normal  Thought process: normal  Thought content:   WNL  Sensory/Perceptual disturbances:   WNL  Orientation: oriented to person, place, time/date, and situation  Attention: Good  Concentration: Good  Memory: WNL  Fund of knowledge:  Good  Insight:   Good  Judgment:  Good  Impulse Control: Good   Risk Assessment: Danger to Self:  No Self-injurious Behavior: No Danger to Others: No Duty to Warn:no Physical Aggression / Violence:No  Access to Firearms a concern: No  Gang Involvement:No   Subjective: Pt present for face-to-face individual therapy via video.  Pt consents to telehealth video session and is aware of limitations and benefits of virtual sessions.  Location of pt: home Location of therapist: home office.   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  She hopes to get an interview soon bc they want to fill the position in February.   Pt talked about her current job.  This month has been hard at work.  Pt has felt very stressed and overwhelmed.   Things have occurred that pt has felt are not fair.  There has been staff drama and communication problems.   Addressed the issues and interactions and how they impact pt.  Pt did have a talk with her supervisor and subsequently the communication has improve some this week.  Worked on self care strategies. Provided supportive therapy.  Interventions: Cognitive Behavioral Therapy and Insight-Oriented  Diagnosis:  F43.23  Plan of Care: Recommend ongoing therapy.   Pt  participated in setting treatment goals.    Pt wants to learn how to manage situations that will keep her happy and others happy too. Pt wants to improve coping skills and self esteem.   Plan to meet monthly.    Pt agrees with treatment plan.    Treatment Plan (treatment plan target date: 11/13/2023) Client Abilities/Strengths  Pt is bright, engaging, and motivated for therapy.  Client Treatment Preferences  Individual therapy.  Client Statement of Needs  Improve copings skills. Improve self esteem.  Symptoms  Depressed or irritable mood. Excessive and/or unrealistic worry that is difficult to control occurring more days than not for at least 6 months about a number of events or activities. Hypervigilance (e.g., feeling constantly on edge, experiencing concentration difficulties, having trouble falling or staying asleep, exhibiting a general state of irritability). Low self-esteem. Problems Addressed  Unipolar Depression, Anxiety Goals 1. Alleviate depressive symptoms and return to previous level of effective functioning. 2. Appropriately grieve the loss in order to normalize mood and to return to previously adaptive level of functioning. Objective Learn and implement behavioral strategies to overcome depression. Target Date: 2023-11-13 Frequency: Monthly  Progress: 30 Modality: individual  Related Interventions Assist the client in developing skills that increase the likelihood of deriving pleasure from behavioral activation (e.g., assertiveness skills, developing an exercise plan, less internal/more external focus, increased social involvement); reinforce success. Engage the client in "behavioral activation," increasing his/her activity level and contact with sources of reward, while identifying processes that inhibit activation. use behavioral techniques such as  instruction, rehearsal, role-playing, role reversal, as needed, to facilitate activity in the client's daily life; reinforce  success. 3. Develop healthy interpersonal relationships that lead to the alleviation and help prevent the relapse of depression. 4. Develop healthy thinking patterns and beliefs about self, others, and the world that lead to the alleviation and help prevent the relapse of depression. 5. Enhance ability to effectively cope with the full variety of life's worries and anxieties. 6. Learn and implement coping skills that result in a reduction of anxiety and worry, and improved daily functioning. Objective Learn and implement problem-solving strategies for realistically addressing worries. Target Date: 2023-11-13 Frequency: Monthly  Progress: 30 Modality: individual  Related Interventions Assign the client a homework exercise in which he/she problem-solves a current problem (see Mastery of Your Anxiety and Worry: Workbook by Elenora Fender and Filbert Schilder or Generalized Anxiety Disorder by Elesa Hacker, and Filbert Schilder); review, reinforce success, and provide corrective feedback toward improvement. Teach the client problem-solving strategies involving specifically defining a problem, generating options for addressing it, evaluating the pros and cons of each option, selecting and implementing an optional action, and reevaluating and refining the action. Objective Learn and implement calming skills to reduce overall anxiety and manage anxiety symptoms. Target Date: 2023-11-13 Frequency: Monthly  Progress: 30 Modality: individual  Related Interventions Assign the client to read about progressive muscle relaxation and other calming strategies in relevant books or treatment manuals (e.g., Progressive Relaxation Training by Twana First; Mastery of Your Anxiety and Worry: Workbook by Earlie Counts). Assign the client homework each session in which he/she practices relaxation exercises daily, gradually applying them progressively from non-anxiety-provoking to anxiety-provoking situations; review and reinforce  success while providing corrective feedback toward improvement. Teach the client calming/relaxation skills (e.g., applied relaxation, progressive muscle relaxation, cue controlled relaxation; mindful breathing; biofeedback) and how to discriminate better between relaxation and tension; teach the client how to apply these skills to his/her daily life. 7. Recognize, accept, and cope with feelings of depression. 8. Reduce overall frequency, intensity, and duration of the anxiety so that daily functioning is not impaired. 9. Resolve the core conflict that is the source of anxiety. 10. Stabilize anxiety level while increasing ability to function on a daily basis. Diagnosis F43.23  Conditions For Discharge Achievement of treatment goals and objectives   Salomon Fick, LCSW

## 2023-04-25 ENCOUNTER — Ambulatory Visit: Payer: Commercial Managed Care - PPO | Admitting: Psychology

## 2023-05-23 ENCOUNTER — Ambulatory Visit: Payer: Commercial Managed Care - PPO | Admitting: Psychology

## 2023-05-23 DIAGNOSIS — F4323 Adjustment disorder with mixed anxiety and depressed mood: Secondary | ICD-10-CM | POA: Diagnosis not present

## 2023-05-23 NOTE — Progress Notes (Signed)
 [facility] Counselor/Therapist Progress Note  Patient ID: Lisa Patterson, MRN: [id number],    Date: 05/23/2023  Time Spent: 5:00pm-5:55pm   55 minutes   Treatment Type: Individual Therapy  Reported Symptoms: stress  Mental Status Exam: Appearance:  Casual     Behavior: Appropriate  Motor: Normal  Speech/Language:  Normal Rate  Affect: Appropriate  Mood: normal  Thought process: normal  Thought content:   WNL  Sensory/Perceptual disturbances:   WNL  Orientation: oriented to person, place, time/date, and situation  Attention: Good  Concentration: Good  Memory: WNL  Fund of knowledge:  Good  Insight:   Good  Judgment:  Good  Impulse Control: Good   Risk Assessment: Danger to Self:  No Self-injurious Behavior: No Danger to Others: No Duty to Warn:no Physical Aggression / Violence:No  Access to Firearms a concern: No  Gang Involvement:No   Subjective: Pt present for face-to-face individual therapy via video.  Pt consents to telehealth video session and is aware of limitations and benefits of virtual sessions.  Location of pt: home Location of therapist: home office.   Pt talked about work.  There have been issues at her current job where pt has felt unfairly treated.   Addressed the issues and how they impacted pt.  She feels like her supervisor doesn't listen to her and gives special treatment to other employees.    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   She anticipates getting the official job offer very soon.   She hopes to start her new job on April 21st.  She is a little anxious about the new job but really wants to leave her current job. Pt talked about her mother.   Her mother has been treating pt better which is a relief for pt bc that relationship was a big source of stress.  Worked on self care strategies. Provided supportive therapy.  Interventions: Cognitive Behavioral Therapy and  Insight-Oriented  Diagnosis:  F43.23  Plan of Care: Recommend ongoing therapy.   Pt participated in setting treatment goals.    Pt wants to learn how to manage situations that will keep her happy and others happy too. Pt wants to improve coping skills and self esteem.   Plan to meet monthly.    Pt agrees with treatment plan.    Treatment Plan (treatment plan target date: 11/13/2023) Client Abilities/Strengths  Pt is bright, engaging, and motivated for therapy.  Client Treatment Preferences  Individual therapy.  Client Statement of Needs  Improve copings skills. Improve self esteem.  Symptoms  Depressed or irritable mood. Excessive and/or unrealistic worry that is difficult to control occurring more days than not for at least 6 months about a number of events or activities. Hypervigilance (e.g., feeling constantly on edge, experiencing concentration difficulties, having trouble falling or staying asleep, exhibiting a general state of irritability). Low self-esteem. Problems Addressed  Unipolar Depression, Anxiety Goals 1. Alleviate depressive symptoms and return to previous level of effective functioning. 2. Appropriately grieve the loss in order to normalize mood and to return to previously adaptive level of functioning. Objective Learn and implement behavioral strategies to overcome depression. Target Date: 2023-11-13 Frequency: Monthly  Progress: 30 Modality: individual  Related Interventions Assist the client in developing skills that increase the likelihood of deriving pleasure from behavioral activation (e.g., assertiveness skills, developing an exercise plan, less internal/more external focus, increased social involvement); reinforce success. Engage the client in "behavioral activation," increasing his/her activity level and contact with  sources of reward, while identifying processes that inhibit activation. use behavioral techniques such as instruction, rehearsal, role-playing, role  reversal, as needed, to facilitate activity in the client's daily life; reinforce success. 3. Develop healthy interpersonal relationships that lead to the alleviation and help prevent the relapse of depression. 4. Develop healthy thinking patterns and beliefs about self, others, and the world that lead to the alleviation and help prevent the relapse of depression. 5. Enhance ability to effectively cope with the full variety of life's worries and anxieties. 6. Learn and implement coping skills that result in a reduction of anxiety and worry, and improved daily functioning. Objective Learn and implement problem-solving strategies for realistically addressing worries. Target Date: 2023-11-13 Frequency: Monthly  Progress: 30 Modality: individual  Related Interventions Assign the client a homework exercise in which he/she problem-solves a current problem (see Mastery of Your Anxiety and Worry: Workbook by Elenora Fender and Filbert Schilder or Generalized Anxiety Disorder by Elesa Hacker, and Filbert Schilder); review, reinforce success, and provide corrective feedback toward improvement. Teach the client problem-solving strategies involving specifically defining a problem, generating options for addressing it, evaluating the pros and cons of each option, selecting and implementing an optional action, and reevaluating and refining the action. Objective Learn and implement calming skills to reduce overall anxiety and manage anxiety symptoms. Target Date: 2023-11-13 Frequency: Monthly  Progress: 30 Modality: individual  Related Interventions Assign the client to read about progressive muscle relaxation and other calming strategies in relevant books or treatment manuals (e.g., Progressive Relaxation Training by Twana First; Mastery of Your Anxiety and Worry: Workbook by Earlie Counts). Assign the client homework each session in which he/she practices relaxation exercises daily, gradually applying them progressively  from non-anxiety-provoking to anxiety-provoking situations; review and reinforce success while providing corrective feedback toward improvement. Teach the client calming/relaxation skills (e.g., applied relaxation, progressive muscle relaxation, cue controlled relaxation; mindful breathing; biofeedback) and how to discriminate better between relaxation and tension; teach the client how to apply these skills to his/her daily life. 7. Recognize, accept, and cope with feelings of depression. 8. Reduce overall frequency, intensity, and duration of the anxiety so that daily functioning is not impaired. 9. Resolve the core conflict that is the source of anxiety. 10. Stabilize anxiety level while increasing ability to function on a daily basis. Diagnosis F43.23  Conditions For Discharge Achievement of treatment goals and objectives   Salomon Fick, LCSW

## 2023-06-13 ENCOUNTER — Other Ambulatory Visit (HOSPITAL_BASED_OUTPATIENT_CLINIC_OR_DEPARTMENT_OTHER): Payer: Self-pay

## 2023-06-20 ENCOUNTER — Ambulatory Visit: Payer: Commercial Managed Care - PPO | Admitting: Psychology

## 2023-06-20 DIAGNOSIS — F4323 Adjustment disorder with mixed anxiety and depressed mood: Secondary | ICD-10-CM

## 2023-06-20 NOTE — Progress Notes (Signed)
 [facility] Counselor/Therapist Progress Note  Patient ID: Lisa Patterson, MRN: [id number],    Date: 06/20/2023  Time Spent: 5:00pm-5:55pm   55 minutes   Treatment Type: Individual Therapy  Reported Symptoms: stress  Mental Status Exam: Appearance:  Casual     Behavior: Appropriate  Motor: Normal  Speech/Language:  Normal Rate  Affect: Appropriate  Mood: normal  Thought process: normal  Thought content:   WNL  Sensory/Perceptual disturbances:   WNL  Orientation: oriented to person, place, time/date, and situation  Attention: Good  Concentration: Good  Memory: WNL  Fund of knowledge:  Good  Insight:   Good  Judgment:  Good  Impulse Control: Good   Risk Assessment: Danger to Self:  No Self-injurious Behavior: No Danger to Others: No Duty to Warn:no Physical Aggression / Violence:No  Access to Firearms a concern: No  Gang Involvement:No   Subjective: Pt present for face-to-face individual therapy via video.  Pt consents to telehealth video session and is aware of limitations and benefits of virtual sessions.  Location of pt: home Location of therapist: home office.   Pt talked about work.  Her current job has been very busy and stressful.   Worked on [employer]. Pt's last day was suppose to be today but HR told pt they need her high school transcripts even though they have her university transcripts.  Pt was very upset bc this has delayed her start date for her new job.   She now can not start her new job until May 4th.   Addressed how this impacts pt.   She states her colleagues at her current job and new job have been supportive so this has helped.   Pt is getting settled in her new home and working on gardening which she enjoys.   Worked on self care strategies. Provided supportive therapy.  Interventions: Cognitive Behavioral Therapy and Insight-Oriented  Diagnosis:  F43.23  Plan of Care: Recommend ongoing therapy.   Pt participated in  setting treatment goals.    Pt wants to learn how to manage situations that will keep her happy and others happy too. Pt wants to improve coping skills and self esteem.   Plan to meet monthly.    Pt agrees with treatment plan.    Treatment Plan (treatment plan target date: 11/13/2023) Client Abilities/Strengths  Pt is bright, engaging, and motivated for therapy.  Client Treatment Preferences  Individual therapy.  Client Statement of Needs  Improve copings skills. Improve self esteem.  Symptoms  Depressed or irritable mood. Excessive and/or unrealistic worry that is difficult to control occurring more days than not for at least 6 months about a number of events or activities. Hypervigilance (e.g., feeling constantly on edge, experiencing concentration difficulties, having trouble falling or staying asleep, exhibiting a general state of irritability). Low self-esteem. Problems Addressed  Unipolar Depression, Anxiety Goals 1. Alleviate depressive symptoms and return to previous level of effective functioning. 2. Appropriately grieve the loss in order to normalize mood and to return to previously adaptive level of functioning. Objective Learn and implement behavioral strategies to overcome depression. Target Date: 2023-11-13 Frequency: Monthly  Progress: 30 Modality: individual  Related Interventions Assist the client in developing skills that increase the likelihood of deriving pleasure from behavioral activation (e.g., assertiveness skills, developing an exercise plan, less internal/more external focus, increased social involvement); reinforce success. Engage the client in "behavioral activation," increasing his/her activity level and contact with sources of reward, while identifying processes that inhibit activation. use behavioral techniques  such as instruction, rehearsal, role-playing, role reversal, as needed, to facilitate activity in the client's daily life; reinforce success. 3. Develop  healthy interpersonal relationships that lead to the alleviation and help prevent the relapse of depression. 4. Develop healthy thinking patterns and beliefs about self, others, and the world that lead to the alleviation and help prevent the relapse of depression. 5. Enhance ability to effectively cope with the full variety of life's worries and anxieties. 6. Learn and implement coping skills that result in a reduction of anxiety and worry, and improved daily functioning. Objective Learn and implement problem-solving strategies for realistically addressing worries. Target Date: 2023-11-13 Frequency: Monthly  Progress: 30 Modality: individual  Related Interventions Assign the client a homework exercise in which he/she problem-solves a current problem (see Mastery of Your Anxiety and Worry: Workbook by Colbert Dates and Edna Gouty or Generalized Anxiety Disorder by Woodson He, and Edna Gouty); review, reinforce success, and provide corrective feedback toward improvement. Teach the client problem-solving strategies involving specifically defining a problem, generating options for addressing it, evaluating the pros and cons of each option, selecting and implementing an optional action, and reevaluating and refining the action. Objective Learn and implement calming skills to reduce overall anxiety and manage anxiety symptoms. Target Date: 2023-11-13 Frequency: Monthly  Progress: 30 Modality: individual  Related Interventions Assign the client to read about progressive muscle relaxation and other calming strategies in relevant books or treatment manuals (e.g., Progressive Relaxation Training by Juleen Oakland; Mastery of Your Anxiety and Worry: Workbook by Rodney Clamp). Assign the client homework each session in which he/she practices relaxation exercises daily, gradually applying them progressively from non-anxiety-provoking to anxiety-provoking situations; review and reinforce success while providing  corrective feedback toward improvement. Teach the client calming/relaxation skills (e.g., applied relaxation, progressive muscle relaxation, cue controlled relaxation; mindful breathing; biofeedback) and how to discriminate better between relaxation and tension; teach the client how to apply these skills to his/her daily life. 7. Recognize, accept, and cope with feelings of depression. 8. Reduce overall frequency, intensity, and duration of the anxiety so that daily functioning is not impaired. 9. Resolve the core conflict that is the source of anxiety. 10. Stabilize anxiety level while increasing ability to function on a daily basis. Diagnosis F43.23  Conditions For Discharge Achievement of treatment goals and objectives   Willey Harrier, LCSW

## 2023-07-25 ENCOUNTER — Ambulatory Visit (INDEPENDENT_AMBULATORY_CARE_PROVIDER_SITE_OTHER): Admitting: Psychology

## 2023-07-25 DIAGNOSIS — F4323 Adjustment disorder with mixed anxiety and depressed mood: Secondary | ICD-10-CM | POA: Diagnosis not present

## 2023-07-25 NOTE — Progress Notes (Signed)
 [facility] Counselor/Therapist Progress Note  Patient ID: Guynell Kleiber, MRN: [id number],    Date: 07/25/2023  Time Spent: 5:00pm-5:55pm   55 minutes   Treatment Type: Individual Therapy  Reported Symptoms: stress  Mental Status Exam: Appearance:  Casual     Behavior: Appropriate  Motor: Normal  Speech/Language:  Normal Rate  Affect: Appropriate  Mood: normal  Thought process: normal  Thought content:   WNL  Sensory/Perceptual disturbances:   WNL  Orientation: oriented to person, place, time/date, and situation  Attention: Good  Concentration: Good  Memory: WNL  Fund of knowledge:  Good  Insight:   Good  Judgment:  Good  Impulse Control: Good   Risk Assessment: Danger to Self:  No Self-injurious Behavior: No Danger to Others: No Duty to Warn:no Physical Aggression / Violence:No  Access to Firearms a concern: No  Gang Involvement:No   Subjective: Pt present for face-to-face individual therapy via video.  Pt consents to telehealth video session and is aware of limitations and benefits of virtual sessions.  Location of pt: home Location of therapist: home office.   Pt talked about work.  She is in her second week of her new job and she really likes it.  Pt is working from home and she loves being able to work from home.  Pt is still in training but so far the job seems much less stressful than her previous job.   Pt states she has a lot to learn but she is excited to learn it.   Pt is continuing to get settled in her new house.  She has been in the house for 7 months.  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  Pt and husband have met their neighbors and they have all been very nice.   Pt states it was very stressful to move and get settled but now she is happy to be there.    Pt feels like things are looking up in her life.    Worked on self care strategies. Provided supportive therapy.  Interventions: Cognitive Behavioral Therapy and  Insight-Oriented  Diagnosis:  F43.23  Plan of Care: Recommend ongoing therapy.   Pt participated in setting treatment goals.    Pt wants to learn how to manage situations that will keep her happy and others happy too. Pt wants to improve coping skills and self esteem.   Plan to meet monthly.    Pt agrees with treatment plan.    Treatment Plan (treatment plan target date: 11/13/2023) Client Abilities/Strengths  Pt is bright, engaging, and motivated for therapy.  Client Treatment Preferences  Individual therapy.  Client Statement of Needs  Improve copings skills. Improve self esteem.  Symptoms  Depressed or irritable mood. Excessive and/or unrealistic worry that is difficult to control occurring more days than not for at least 6 months about a number of events or activities. Hypervigilance (e.g., feeling constantly on edge, experiencing concentration difficulties, having trouble falling or staying asleep, exhibiting a general state of irritability). Low self-esteem. Problems Addressed  Unipolar Depression, Anxiety Goals 1. Alleviate depressive symptoms and return to previous level of effective functioning. 2. Appropriately grieve the loss in order to normalize mood and to return to previously adaptive level of functioning. Objective Learn and implement behavioral strategies to overcome depression. Target Date: 2023-11-13 Frequency: Monthly  Progress: 30 Modality: individual  Related Interventions Assist the client in developing skills that increase the likelihood of deriving pleasure from behavioral activation (e.g., assertiveness  skills, developing an exercise plan, less internal/more external focus, increased social involvement); reinforce success. Engage the client in "behavioral activation," increasing his/her activity level and contact with sources of reward, while identifying processes that inhibit activation. use behavioral techniques such as instruction, rehearsal, role-playing, role  reversal, as needed, to facilitate activity in the client's daily life; reinforce success. 3. Develop healthy interpersonal relationships that lead to the alleviation and help prevent the relapse of depression. 4. Develop healthy thinking patterns and beliefs about self, others, and the world that lead to the alleviation and help prevent the relapse of depression. 5. Enhance ability to effectively cope with the full variety of life's worries and anxieties. 6. Learn and implement coping skills that result in a reduction of anxiety and worry, and improved daily functioning. Objective Learn and implement problem-solving strategies for realistically addressing worries. Target Date: 2023-11-13 Frequency: Monthly  Progress: 30 Modality: individual  Related Interventions Assign the client a homework exercise in which he/she problem-solves a current problem (see Mastery of Your Anxiety and Worry: Workbook by Colbert Dates and Edna Gouty or Generalized Anxiety Disorder by Woodson He, and Edna Gouty); review, reinforce success, and provide corrective feedback toward improvement. Teach the client problem-solving strategies involving specifically defining a problem, generating options for addressing it, evaluating the pros and cons of each option, selecting and implementing an optional action, and reevaluating and refining the action. Objective Learn and implement calming skills to reduce overall anxiety and manage anxiety symptoms. Target Date: 2023-11-13 Frequency: Monthly  Progress: 30 Modality: individual  Related Interventions Assign the client to read about progressive muscle relaxation and other calming strategies in relevant books or treatment manuals (e.g., Progressive Relaxation Training by Juleen Oakland; Mastery of Your Anxiety and Worry: Workbook by Rodney Clamp). Assign the client homework each session in which he/she practices relaxation exercises daily, gradually applying them progressively  from non-anxiety-provoking to anxiety-provoking situations; review and reinforce success while providing corrective feedback toward improvement. Teach the client calming/relaxation skills (e.g., applied relaxation, progressive muscle relaxation, cue controlled relaxation; mindful breathing; biofeedback) and how to discriminate better between relaxation and tension; teach the client how to apply these skills to his/her daily life. 7. Recognize, accept, and cope with feelings of depression. 8. Reduce overall frequency, intensity, and duration of the anxiety so that daily functioning is not impaired. 9. Resolve the core conflict that is the source of anxiety. 10. Stabilize anxiety level while increasing ability to function on a daily basis. Diagnosis F43.23  Conditions For Discharge Achievement of treatment goals and objectives   Willey Harrier, LCSW

## 2023-08-22 ENCOUNTER — Ambulatory Visit (INDEPENDENT_AMBULATORY_CARE_PROVIDER_SITE_OTHER): Admitting: Psychology

## 2023-08-22 DIAGNOSIS — F4323 Adjustment disorder with mixed anxiety and depressed mood: Secondary | ICD-10-CM

## 2023-08-22 NOTE — Progress Notes (Signed)
 [facility] Counselor/Therapist Progress Note  Patient ID: Lisa Patterson, MRN: [id number],    Date: 08/22/2023  Time Spent: 5:00pm-5:55pm   55 minutes   Treatment Type: Individual Therapy  Reported Symptoms: stress  Mental Status Exam: Appearance:  Casual     Behavior: Appropriate  Motor: Normal  Speech/Language:  Normal Rate  Affect: Appropriate  Mood: normal  Thought process: normal  Thought content:   WNL  Sensory/Perceptual disturbances:   WNL  Orientation: oriented to person, place, time/date, and situation  Attention: Good  Concentration: Good  Memory: WNL  Fund of knowledge:  Good  Insight:   Good  Judgment:  Good  Impulse Control: Good   Risk Assessment: Danger to Self:  No Self-injurious Behavior: No Danger to Others: No Duty to Warn:no Physical Aggression / Violence:No  Access to Firearms a concern: No  Gang Involvement:No   Subjective: Pt present for face-to-face individual therapy via video.  Pt consents to telehealth video session and is aware of limitations and benefits of virtual sessions.  Location of pt: home Location of therapist: home office.   Pt talked about work.   Pt is working from home and she loves being able to work from home.  Pt is still in training but so far the job seems much less stressful than her previous job.   Pt states she is learning a lot and loves her new job.    Pt is continuing to get settled in her new house.   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  Pt is enjoying being home and has been able to cook more and do more of the things she likes.   Pt talked about her daughter.  Her daughter has been upset bc her husband's 63 yo grandfather is not doing well.   Addressed how this impacts the family.    Worked on self care strategies. Provided supportive therapy.  Interventions: Cognitive Behavioral Therapy and Insight-Oriented  Diagnosis:  F43.23  Plan of Care: Recommend ongoing therapy.    Pt participated in setting treatment goals.    Pt wants to learn how to manage situations that will keep her happy and others happy too. Pt wants to improve coping skills and self esteem.   Plan to meet monthly.    Pt agrees with treatment plan.    Treatment Plan (treatment plan target date: 11/13/2023) Client Abilities/Strengths  Pt is bright, engaging, and motivated for therapy.  Client Treatment Preferences  Individual therapy.  Client Statement of Needs  Improve copings skills. Improve self esteem.  Symptoms  Depressed or irritable mood. Excessive and/or unrealistic worry that is difficult to control occurring more days than not for at least 6 months about a number of events or activities. Hypervigilance (e.g., feeling constantly on edge, experiencing concentration difficulties, having trouble falling or staying asleep, exhibiting a general state of irritability). Low self-esteem. Problems Addressed  Unipolar Depression, Anxiety Goals 1. Alleviate depressive symptoms and return to previous level of effective functioning. 2. Appropriately grieve the loss in order to normalize mood and to return to previously adaptive level of functioning. Objective Learn and implement behavioral strategies to overcome depression. Target Date: 2023-11-13 Frequency: Monthly  Progress: 30 Modality: individual  Related Interventions Assist the client in developing skills that increase the likelihood of deriving pleasure from behavioral activation (e.g., assertiveness skills, developing an exercise plan, less internal/more external focus, increased social involvement); reinforce success. Engage the client in behavioral activation, increasing his/her activity  level and contact with sources of reward, while identifying processes that inhibit activation. use behavioral techniques such as instruction, rehearsal, role-playing, role reversal, as needed, to facilitate activity in the client's daily life; reinforce  success. 3. Develop healthy interpersonal relationships that lead to the alleviation and help prevent the relapse of depression. 4. Develop healthy thinking patterns and beliefs about self, others, and the world that lead to the alleviation and help prevent the relapse of depression. 5. Enhance ability to effectively cope with the full variety of life's worries and anxieties. 6. Learn and implement coping skills that result in a reduction of anxiety and worry, and improved daily functioning. Objective Learn and implement problem-solving strategies for realistically addressing worries. Target Date: 2023-11-13 Frequency: Monthly  Progress: 30 Modality: individual  Related Interventions Assign the client a homework exercise in which he/she problem-solves a current problem (see Mastery of Your Anxiety and Worry: Workbook by Colbert Dates and Edna Gouty or Generalized Anxiety Disorder by Woodson He, and Edna Gouty); review, reinforce success, and provide corrective feedback toward improvement. Teach the client problem-solving strategies involving specifically defining a problem, generating options for addressing it, evaluating the pros and cons of each option, selecting and implementing an optional action, and reevaluating and refining the action. Objective Learn and implement calming skills to reduce overall anxiety and manage anxiety symptoms. Target Date: 2023-11-13 Frequency: Monthly  Progress: 30 Modality: individual  Related Interventions Assign the client to read about progressive muscle relaxation and other calming strategies in relevant books or treatment manuals (e.g., Progressive Relaxation Training by Juleen Oakland; Mastery of Your Anxiety and Worry: Workbook by Rodney Clamp). Assign the client homework each session in which he/she practices relaxation exercises daily, gradually applying them progressively from non-anxiety-provoking to anxiety-provoking situations; review and reinforce  success while providing corrective feedback toward improvement. Teach the client calming/relaxation skills (e.g., applied relaxation, progressive muscle relaxation, cue controlled relaxation; mindful breathing; biofeedback) and how to discriminate better between relaxation and tension; teach the client how to apply these skills to his/her daily life. 7. Recognize, accept, and cope with feelings of depression. 8. Reduce overall frequency, intensity, and duration of the anxiety so that daily functioning is not impaired. 9. Resolve the core conflict that is the source of anxiety. 10. Stabilize anxiety level while increasing ability to function on a daily basis. Diagnosis F43.23  Conditions For Discharge Achievement of treatment goals and objectives   Willey Harrier, LCSW

## 2023-09-09 ENCOUNTER — Other Ambulatory Visit: Payer: Self-pay | Admitting: Family Medicine

## 2023-09-09 ENCOUNTER — Other Ambulatory Visit (HOSPITAL_BASED_OUTPATIENT_CLINIC_OR_DEPARTMENT_OTHER): Payer: Self-pay

## 2023-09-09 MED ORDER — METOPROLOL SUCCINATE ER 25 MG PO TB24
25.0000 mg | ORAL_TABLET | Freq: Every day | ORAL | 11 refills | Status: AC
  Filled 2023-09-09: qty 30, 30d supply, fill #0
  Filled 2023-10-08: qty 30, 30d supply, fill #1
  Filled 2023-11-04 – 2023-12-17 (×3): qty 30, 30d supply, fill #2
  Filled 2024-01-16: qty 30, 30d supply, fill #3
  Filled 2024-03-02: qty 30, 30d supply, fill #4

## 2023-09-09 MED ORDER — AMLODIPINE BESYLATE 5 MG PO TABS
5.0000 mg | ORAL_TABLET | Freq: Every day | ORAL | 11 refills | Status: AC
  Filled 2023-09-09: qty 30, 30d supply, fill #0
  Filled 2023-10-08: qty 30, 30d supply, fill #1
  Filled 2023-11-04 – 2023-12-17 (×3): qty 30, 30d supply, fill #2
  Filled 2024-01-16: qty 30, 30d supply, fill #3
  Filled 2024-03-02: qty 30, 30d supply, fill #4

## 2023-09-20 ENCOUNTER — Ambulatory Visit: Payer: Commercial Managed Care - PPO | Admitting: Family Medicine

## 2023-09-25 ENCOUNTER — Other Ambulatory Visit (HOSPITAL_BASED_OUTPATIENT_CLINIC_OR_DEPARTMENT_OTHER): Payer: Self-pay | Admitting: Family Medicine

## 2023-09-25 DIAGNOSIS — Z1231 Encounter for screening mammogram for malignant neoplasm of breast: Secondary | ICD-10-CM

## 2023-09-26 ENCOUNTER — Ambulatory Visit: Admitting: Psychology

## 2023-09-26 DIAGNOSIS — F4323 Adjustment disorder with mixed anxiety and depressed mood: Secondary | ICD-10-CM | POA: Diagnosis not present

## 2023-09-26 NOTE — Progress Notes (Signed)
 [facility] Counselor/Therapist Progress Note  Patient ID: Lisa Patterson, MRN: [id number],    Date: 09/26/2023  Time Spent: 5:00pm-5:55pm   55 minutes   Treatment Type: Individual Therapy  Reported Symptoms: stress  Mental Status Exam: Appearance:  Casual     Behavior: Appropriate  Motor: Normal  Speech/Language:  Normal Rate  Affect: Appropriate  Mood: normal  Thought process: normal  Thought content:   WNL  Sensory/Perceptual disturbances:   WNL  Orientation: oriented to person, place, time/date, and situation  Attention: Good  Concentration: Good  Memory: WNL  Fund of knowledge:  Good  Insight:   Good  Judgment:  Good  Impulse Control: Good   Risk Assessment: Danger to Self:  No Self-injurious Behavior: No Danger to Others: No Duty to Warn:no Physical Aggression / Violence:No  Access to Firearms a concern: No  Gang Involvement:No   Subjective: Pt present for face-to-face individual therapy via video.  Pt consents to telehealth video session and is aware of limitations and benefits of virtual sessions.  Location of pt: home Location of therapist: home office.   Pt talked about work.   Pt is working from home and she loves being able to work from home.  Pt is still learning a lot on her new job but overall she really likes the job.  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   That can get frustrating and pt feels badly for the patients when the insurance companies deny coverage.   Pt talked about worries about her brother.  He was in an accident and broke his hip.  He lives in [place]  so it was hard for the family who lives in [place] to help him.   Pt's son traveled to [place]  to assist pt's brother and pt feels very proud of her son.   Pt is feeling settled in her new home and feels her decision to move there was a good one.  Pt states her relationship with her mother has improved since pt lives closer to her now.   Worked on self care  strategies. Provided supportive therapy.  Interventions: Cognitive Behavioral Therapy and Insight-Oriented  Diagnosis:  F43.23  Plan of Care: Recommend ongoing therapy.   Pt participated in setting treatment goals.    Pt wants to learn how to manage situations that will keep her happy and others happy too. Pt wants to improve coping skills and self esteem.   Plan to meet monthly.    Pt agrees with treatment plan.    Treatment Plan (treatment plan target date: 11/13/2023) Client Abilities/Strengths  Pt is bright, engaging, and motivated for therapy.  Client Treatment Preferences  Individual therapy.  Client Statement of Needs  Improve copings skills. Improve self esteem.  Symptoms  Depressed or irritable mood. Excessive and/or unrealistic worry that is difficult to control occurring more days than not for at least 6 months about a number of events or activities. Hypervigilance (e.g., feeling constantly on edge, experiencing concentration difficulties, having trouble falling or staying asleep, exhibiting a general state of irritability). Low self-esteem. Problems Addressed  Unipolar Depression, Anxiety Goals 1. Alleviate depressive symptoms and return to previous level of effective functioning. 2. Appropriately grieve the loss in order to normalize mood and to return to previously adaptive level of functioning. Objective Learn and implement behavioral strategies to overcome depression. Target Date: 2023-11-13 Frequency: Monthly  Progress: 30 Modality: individual  Related Interventions Assist the client in developing skills that increase the likelihood of deriving pleasure  from behavioral activation (e.g., assertiveness skills, developing an exercise plan, less internal/more external focus, increased social involvement); reinforce success. Engage the client in behavioral activation, increasing his/her activity level and contact with sources of reward, while identifying processes that  inhibit activation. use behavioral techniques such as instruction, rehearsal, role-playing, role reversal, as needed, to facilitate activity in the client's daily life; reinforce success. 3. Develop healthy interpersonal relationships that lead to the alleviation and help prevent the relapse of depression. 4. Develop healthy thinking patterns and beliefs about self, others, and the world that lead to the alleviation and help prevent the relapse of depression. 5. Enhance ability to effectively cope with the full variety of life's worries and anxieties. 6. Learn and implement coping skills that result in a reduction of anxiety and worry, and improved daily functioning. Objective Learn and implement problem-solving strategies for realistically addressing worries. Target Date: 2023-11-13 Frequency: Monthly  Progress: 30 Modality: individual  Related Interventions Assign the client a homework exercise in which he/she problem-solves a current problem (see Mastery of Your Anxiety and Worry: Workbook by Richarda and Jonne or Generalized Anxiety Disorder by Delores Filler, and Jonne); review, reinforce success, and provide corrective feedback toward improvement. Teach the client problem-solving strategies involving specifically defining a problem, generating options for addressing it, evaluating the pros and cons of each option, selecting and implementing an optional action, and reevaluating and refining the action. Objective Learn and implement calming skills to reduce overall anxiety and manage anxiety symptoms. Target Date: 2023-11-13 Frequency: Monthly  Progress: 30 Modality: individual  Related Interventions Assign the client to read about progressive muscle relaxation and other calming strategies in relevant books or treatment manuals (e.g., Progressive Relaxation Training by Thornell armin Collier; Mastery of Your Anxiety and Worry: Workbook by Richarda armin Jonne). Assign the client homework each  session in which he/she practices relaxation exercises daily, gradually applying them progressively from non-anxiety-provoking to anxiety-provoking situations; review and reinforce success while providing corrective feedback toward improvement. Teach the client calming/relaxation skills (e.g., applied relaxation, progressive muscle relaxation, cue controlled relaxation; mindful breathing; biofeedback) and how to discriminate better between relaxation and tension; teach the client how to apply these skills to his/her daily life. 7. Recognize, accept, and cope with feelings of depression. 8. Reduce overall frequency, intensity, and duration of the anxiety so that daily functioning is not impaired. 9. Resolve the core conflict that is the source of anxiety. 10. Stabilize anxiety level while increasing ability to function on a daily basis. Diagnosis F43.23  Conditions For Discharge Achievement of treatment goals and objectives   Veva Alma, LCSW

## 2023-10-07 ENCOUNTER — Other Ambulatory Visit: Payer: Self-pay | Admitting: Family Medicine

## 2023-10-07 ENCOUNTER — Other Ambulatory Visit (HOSPITAL_BASED_OUTPATIENT_CLINIC_OR_DEPARTMENT_OTHER): Payer: Self-pay

## 2023-10-07 MED ORDER — OMEPRAZOLE 40 MG PO CPDR
40.0000 mg | DELAYED_RELEASE_CAPSULE | Freq: Every day | ORAL | 1 refills | Status: AC
  Filled 2023-10-07 – 2023-10-11 (×2): qty 90, 90d supply, fill #0
  Filled 2024-03-02: qty 90, 90d supply, fill #1

## 2023-10-11 ENCOUNTER — Other Ambulatory Visit (HOSPITAL_BASED_OUTPATIENT_CLINIC_OR_DEPARTMENT_OTHER): Payer: Self-pay

## 2023-10-23 ENCOUNTER — Ambulatory Visit: Admitting: Family Medicine

## 2023-10-29 ENCOUNTER — Encounter (HOSPITAL_BASED_OUTPATIENT_CLINIC_OR_DEPARTMENT_OTHER): Payer: Self-pay

## 2023-10-29 ENCOUNTER — Ambulatory Visit (HOSPITAL_BASED_OUTPATIENT_CLINIC_OR_DEPARTMENT_OTHER)
Admission: RE | Admit: 2023-10-29 | Discharge: 2023-10-29 | Disposition: A | Discharge status: Home / Self Care | Source: Ambulatory Visit | Attending: Family Medicine | Admitting: Family Medicine

## 2023-10-29 DIAGNOSIS — Z01419 Encounter for gynecological examination (general) (routine) without abnormal findings: Secondary | ICD-10-CM | POA: Diagnosis not present

## 2023-10-29 DIAGNOSIS — Z1231 Encounter for screening mammogram for malignant neoplasm of breast: Secondary | ICD-10-CM | POA: Insufficient documentation

## 2023-10-29 DIAGNOSIS — Z6824 Body mass index (BMI) 24.0-24.9, adult: Secondary | ICD-10-CM | POA: Diagnosis not present

## 2023-10-29 LAB — HM MAMMOGRAPHY

## 2023-10-31 ENCOUNTER — Ambulatory Visit: Admitting: Psychology

## 2023-11-01 ENCOUNTER — Ambulatory Visit: Admitting: Family Medicine

## 2023-11-01 VITALS — BP 110/70 | HR 86 | Wt 131.2 lb

## 2023-11-01 DIAGNOSIS — E785 Hyperlipidemia, unspecified: Secondary | ICD-10-CM

## 2023-11-01 DIAGNOSIS — I10 Essential (primary) hypertension: Secondary | ICD-10-CM

## 2023-11-01 DIAGNOSIS — H938X1 Other specified disorders of right ear: Secondary | ICD-10-CM

## 2023-11-01 LAB — CBC WITH DIFFERENTIAL/PLATELET
Basophils Absolute: 0 K/uL (ref 0.0–0.1)
Basophils Relative: 0.7 % (ref 0.0–3.0)
Eosinophils Absolute: 0 K/uL (ref 0.0–0.7)
Eosinophils Relative: 1 % (ref 0.0–5.0)
HCT: 38.4 % (ref 36.0–46.0)
Hemoglobin: 12.6 g/dL (ref 12.0–15.0)
Lymphocytes Relative: 33.9 % (ref 12.0–46.0)
Lymphs Abs: 1.2 K/uL (ref 0.7–4.0)
MCHC: 32.7 g/dL (ref 30.0–36.0)
MCV: 86.1 fl (ref 78.0–100.0)
Monocytes Absolute: 0.4 K/uL (ref 0.1–1.0)
Monocytes Relative: 10.7 % (ref 3.0–12.0)
Neutro Abs: 1.9 K/uL (ref 1.4–7.7)
Neutrophils Relative %: 53.7 % (ref 43.0–77.0)
Platelets: 321 K/uL (ref 150.0–400.0)
RBC: 4.46 Mil/uL (ref 3.87–5.11)
RDW: 14.8 % (ref 11.5–15.5)
WBC: 3.5 K/uL — ABNORMAL LOW (ref 4.0–10.5)

## 2023-11-01 LAB — LIPID PANEL
Cholesterol: 224 mg/dL — ABNORMAL HIGH (ref 0–200)
HDL: 69.5 mg/dL (ref >39.00)
LDL Cholesterol: 132 mg/dL — ABNORMAL HIGH (ref 0–99)
NonHDL: 154.32
Total CHOL/HDL Ratio: 3
Triglycerides: 110 mg/dL (ref 0.0–149.0)
VLDL: 22 mg/dL (ref 0.0–40.0)

## 2023-11-01 LAB — HEPATIC FUNCTION PANEL
ALT: 9 U/L (ref 0–35)
AST: 14 U/L (ref 0–37)
Albumin: 4.3 g/dL (ref 3.5–5.2)
Alkaline Phosphatase: 79 U/L (ref 39–117)
Bilirubin, Direct: 0.2 mg/dL (ref 0.0–0.3)
Total Bilirubin: 1.3 mg/dL — ABNORMAL HIGH (ref 0.2–1.2)
Total Protein: 7.4 g/dL (ref 6.0–8.3)

## 2023-11-01 LAB — BASIC METABOLIC PANEL WITH GFR
BUN: 9 mg/dL (ref 6–23)
CO2: 27 meq/L (ref 19–32)
Calcium: 8.9 mg/dL (ref 8.4–10.5)
Chloride: 102 meq/L (ref 96–112)
Creatinine, Ser: 0.68 mg/dL (ref 0.40–1.20)
GFR: 101.2 mL/min (ref >60.00)
Glucose, Bld: 78 mg/dL (ref 70–99)
Potassium: 3.7 meq/L (ref 3.5–5.1)
Sodium: 139 meq/L (ref 135–145)

## 2023-11-01 LAB — TSH: TSH: 1.86 u[IU]/mL (ref 0.35–5.50)

## 2023-11-01 NOTE — Assessment & Plan Note (Signed)
 Chronic problem.  At last visit LDL 153 and pt was encouraged to start Red Yeast Rice.  Check labs and determine if statin is needed.

## 2023-11-01 NOTE — Assessment & Plan Note (Signed)
Chronic problem.  Currently well controlled on Amlodipine and Metoprolol.  Asymptomatic.  No med changes at this time

## 2023-11-01 NOTE — Progress Notes (Signed)
   Subjective:    Patient ID: Lisa Patterson, female    DOB: 11-10-1970, 53 y.o.   MRN: [id number]  HPI HTN- chronic problem, on Amlodipine  5mg  daily and Metoprolol  XL 25mg  daily w/ good control.  Denies CP, SOB, HA's, visual changes, edema.  Hyperlipidemia- ongoing issue.  Last LDL 153.  Attempting to control w/ diet and exercise but at last visit added Red Yeast Rice.  Has gained 5 lbs since last visit.    R ear fullness- pt reports decreased hearing x2 days.  No pain, no drainage.  Feels like ear needs to pop.   Review of Systems For ROS see HPI     Objective:   Physical Exam Vitals reviewed.  Constitutional:      General: She is not in acute distress.    Appearance: Normal appearance. She is well-developed. She is not ill-appearing.  HENT:     Head: Normocephalic and atraumatic.  Eyes:     Conjunctiva/sclera: Conjunctivae normal.     Pupils: Pupils are equal, round, and reactive to light.  Neck:     Thyroid : No thyromegaly.  Cardiovascular:     Rate and Rhythm: Normal rate and regular rhythm.     Heart sounds: Normal heart sounds. No murmur heard. Pulmonary:     Effort: Pulmonary effort is normal. No respiratory distress.     Breath sounds: Normal breath sounds.  Abdominal:     General: There is no distension.     Palpations: Abdomen is soft.     Tenderness: There is no abdominal tenderness.  Musculoskeletal:     Cervical back: Normal range of motion and neck supple.  Lymphadenopathy:     Cervical: No cervical adenopathy.  Skin:    General: Skin is warm and dry.  Neurological:     Mental Status: She is alert and oriented to person, place, and time.  Psychiatric:        Behavior: Behavior normal.           Assessment & Plan:  Ear fullness- new.  Pt w/ normal TM and canal.  + R nasal congestion.  Suspect eustachian tube dysfxn.  Add daily OTC antihistamine.  Pt expressed understanding and is in agreement w/ plan.

## 2023-11-01 NOTE — Patient Instructions (Addendum)
 Schedule your complete physical in 6 months We'll notify you of your lab results and make any changes if needed Keep up the good work on healthy diet and regular exercise- you look great! Add daily Claritin or Zyrtec for congestion and ear fullness Call with any questions or concerns Stay Safe!  Stay Healthy! HAPPY LABOR DAY!!!

## 2023-11-05 ENCOUNTER — Ambulatory Visit: Payer: Self-pay | Admitting: Family Medicine

## 2023-11-05 NOTE — Progress Notes (Signed)
 Pt has reviewed via MyChart

## 2023-11-15 ENCOUNTER — Other Ambulatory Visit (HOSPITAL_BASED_OUTPATIENT_CLINIC_OR_DEPARTMENT_OTHER): Payer: Self-pay

## 2023-11-22 ENCOUNTER — Other Ambulatory Visit (HOSPITAL_BASED_OUTPATIENT_CLINIC_OR_DEPARTMENT_OTHER): Payer: Self-pay

## 2023-12-05 ENCOUNTER — Ambulatory Visit: Admitting: Psychology

## 2023-12-05 DIAGNOSIS — F4323 Adjustment disorder with mixed anxiety and depressed mood: Secondary | ICD-10-CM | POA: Diagnosis not present
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
## 2023-12-17 ENCOUNTER — Other Ambulatory Visit (HOSPITAL_BASED_OUTPATIENT_CLINIC_OR_DEPARTMENT_OTHER): Payer: Self-pay

## 2024-01-16 ENCOUNTER — Ambulatory Visit: Admitting: Psychology

## 2024-01-16 DIAGNOSIS — F4323 Adjustment disorder with mixed anxiety and depressed mood: Secondary | ICD-10-CM

## 2024-01-16 NOTE — Progress Notes (Signed)
 [facility] Counselor/Therapist Progress Note  Patient ID: Lisa Patterson, MRN: [id number],    Date: 01/16/2024  Time Spent: 5:00pm-5:55pm   55 minutes   Treatment Type: Individual Therapy  Reported Symptoms: stress  Mental Status Exam: Appearance:  Casual     Behavior: Appropriate  Motor: Normal  Speech/Language:  Normal Rate  Affect: Appropriate  Mood: normal  Thought process: normal  Thought content:   WNL  Sensory/Perceptual disturbances:   WNL  Orientation: oriented to person, place, time/date, and situation  Attention: Good  Concentration: Good  Memory: WNL  Fund of knowledge:  Good  Insight:   Good  Judgment:  Good  Impulse Control: Good   Risk Assessment: Danger to Self:  No Self-injurious Behavior: No Danger to Others: No Duty to Warn:no Physical Aggression / Violence:No  Access to Firearms a concern: No  Gang Involvement:No   Subjective: Pt present for face-to-face individual therapy via video.  Pt consents to telehealth video session and is aware of limitations and benefits of virtual sessions. Location of pt: home Location of therapist: home office.  Pt talked about work.  She still likes her new job and has gotten busier.  She likes working from home.  Some work days are stressful bc of the work load.  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  She gets anxious when there are road blocks. Pt has had an increase in anxiety.   Pt's husband's job's department is closing.  He is looking for another job in another department.   Pt is worried about her husband's job situation and about finances.  Addressed pt's anxiety.  She is worrying at night with what if thoughts.  Worked on calming strategies and thought reframing.   Pt talked about her relationship with her mother.   Pt has had to contact her mother's doctors about medications and her sleeping issues.  Pt was having to check on her mother every day which was  stressful.   Pt talked about going through perimenopause.   She is having symptoms that are frustrating.   Provided supportive therapy.   Interventions: Cognitive Behavioral Therapy and Insight-Oriented  Diagnosis:  F43.23  Plan of Care: Recommend ongoing therapy.   Pt participated in setting treatment goals.    Pt wants to continue to improve coping skills and self esteem.  Plan to meet monthly.    Pt agrees with treatment plan.    Treatment Plan (treatment plan target date: 12/04/2024) Client Abilities/Strengths  Pt is bright, engaging, and motivated for therapy.  Client Treatment Preferences  Individual therapy.  Client Statement of Needs  Improve copings skills.  Symptoms  Depressed or irritable mood. Excessive and/or unrealistic worry that is difficult to control occurring more days than not for at least 6 months about a number of events or activities.  Problems Addressed  Unipolar Depression, Anxiety Goals 1. Alleviate depressive symptoms and return to previous level of effective functioning. 2. Appropriately grieve the loss in order to normalize mood and to return to previously adaptive level of functioning. Objective Learn and implement behavioral strategies to overcome depression. Target Date: 2024-12-04 Frequency: Monthly  Progress: 40 Modality: individual  Related Interventions Assist the client in developing skills that increase the likelihood of deriving pleasure from behavioral activation (e.g., assertiveness skills, developing an exercise plan, less internal/more external focus, increased social involvement); reinforce success. Engage the client in behavioral activation, increasing his/her activity level and contact with sources  of reward, while identifying processes that inhibit activation. use behavioral techniques such as instruction, rehearsal, role-playing, role reversal, as needed, to facilitate activity in the client's daily life; reinforce success. 3. Develop  healthy interpersonal relationships that lead to the alleviation and help prevent the relapse of depression. 4. Develop healthy thinking patterns and beliefs about self, others, and the world that lead to the alleviation and help prevent the relapse of depression. 5. Enhance ability to effectively cope with the full variety of life's worries and anxieties. 6. Learn and implement coping skills that result in a reduction of anxiety and worry, and improved daily functioning. Objective Learn and implement problem-solving strategies for realistically addressing worries. Target Date: 2024-12-04 Frequency: Monthly  Progress: 40 Modality: individual  Related Interventions Assign the client a homework exercise in which he/she problem-solves a current problem (see Mastery of Your Anxiety and Worry: Workbook by Richarda and Jonne or Generalized Anxiety Disorder by Delores Filler, and Jonne); review, reinforce success, and provide corrective feedback toward improvement. Teach the client problem-solving strategies involving specifically defining a problem, generating options for addressing it, evaluating the pros and cons of each option, selecting and implementing an optional action, and reevaluating and refining the action. Objective Learn and implement calming skills to reduce overall anxiety and manage anxiety symptoms. Target Date: 2024-12-04 Frequency: Monthly  Progress: 40 Modality: individual  Related Interventions Assign the client to read about progressive muscle relaxation and other calming strategies in relevant books or treatment manuals (e.g., Progressive Relaxation Training by Thornell armin Collier; Mastery of Your Anxiety and Worry: Workbook by Richarda armin Jonne). Assign the client homework each session in which he/she practices relaxation exercises daily, gradually applying them progressively from non-anxiety-provoking to anxiety-provoking situations; review and reinforce success while providing  corrective feedback toward improvement. Teach the client calming/relaxation skills (e.g., applied relaxation, progressive muscle relaxation, cue controlled relaxation; mindful breathing; biofeedback) and how to discriminate better between relaxation and tension; teach the client how to apply these skills to his/her daily life. 7. Recognize, accept, and cope with feelings of depression. 8. Reduce overall frequency, intensity, and duration of the anxiety so that daily functioning is not impaired. 9. Resolve the core conflict that is the source of anxiety. 10. Stabilize anxiety level while increasing ability to function on a daily basis. Diagnosis F43.23  Conditions For Discharge Achievement of treatment goals and objectives   Veva Alma, LCSW

## 2024-03-26 ENCOUNTER — Ambulatory Visit: Admitting: Psychology

## 2024-03-26 DIAGNOSIS — F4323 Adjustment disorder with mixed anxiety and depressed mood: Secondary | ICD-10-CM
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
## 2024-03-27 ENCOUNTER — Encounter: Payer: Self-pay | Admitting: Family Medicine

## 2024-03-27 ENCOUNTER — Ambulatory Visit: Admitting: Family Medicine

## 2024-03-27 VITALS — BP 122/84 | HR 83 | Ht 61.0 in | Wt 131.6 lb

## 2024-03-27 DIAGNOSIS — I10 Essential (primary) hypertension: Secondary | ICD-10-CM

## 2024-03-27 DIAGNOSIS — E559 Vitamin D deficiency, unspecified: Secondary | ICD-10-CM | POA: Diagnosis not present

## 2024-03-27 DIAGNOSIS — Z0000 Encounter for general adult medical examination without abnormal findings: Secondary | ICD-10-CM

## 2024-03-27 LAB — BASIC METABOLIC PANEL WITH GFR
BUN: 9 mg/dL (ref 6–23)
CO2: 29 meq/L (ref 19–32)
Calcium: 9.7 mg/dL (ref 8.4–10.5)
Chloride: 104 meq/L (ref 96–112)
Creatinine, Ser: 0.78 mg/dL (ref 0.40–1.20)
GFR: 88 mL/min
Glucose, Bld: 89 mg/dL (ref 70–99)
Potassium: 4.1 meq/L (ref 3.5–5.1)
Sodium: 140 meq/L (ref 135–145)

## 2024-03-27 LAB — CBC WITH DIFFERENTIAL/PLATELET
Basophils Absolute: 0 K/uL (ref 0.0–0.1)
Basophils Relative: 0.7 % (ref 0.0–3.0)
Eosinophils Absolute: 0.1 K/uL (ref 0.0–0.7)
Eosinophils Relative: 1.9 % (ref 0.0–5.0)
HCT: 38.6 % (ref 36.0–46.0)
Hemoglobin: 12.8 g/dL (ref 12.0–15.0)
Lymphocytes Relative: 32 % (ref 12.0–46.0)
Lymphs Abs: 1.3 K/uL (ref 0.7–4.0)
MCHC: 33.2 g/dL (ref 30.0–36.0)
MCV: 85.3 fl (ref 78.0–100.0)
Monocytes Absolute: 0.4 K/uL (ref 0.1–1.0)
Monocytes Relative: 9.6 % (ref 3.0–12.0)
Neutro Abs: 2.3 K/uL (ref 1.4–7.7)
Neutrophils Relative %: 55.8 % (ref 43.0–77.0)
Platelets: 307 K/uL (ref 150.0–400.0)
RBC: 4.52 Mil/uL (ref 3.87–5.11)
RDW: 14.5 % (ref 11.5–15.5)
WBC: 4.2 K/uL (ref 4.0–10.5)

## 2024-03-27 LAB — LIPID PANEL
Cholesterol: 268 mg/dL — ABNORMAL HIGH (ref 28–200)
HDL: 68.3 mg/dL
LDL Cholesterol: 168 mg/dL — ABNORMAL HIGH (ref 10–99)
NonHDL: 199.58
Total CHOL/HDL Ratio: 4
Triglycerides: 158 mg/dL — ABNORMAL HIGH (ref 10.0–149.0)
VLDL: 31.6 mg/dL (ref 0.0–40.0)

## 2024-03-27 LAB — HEPATIC FUNCTION PANEL
ALT: 16 U/L (ref 3–35)
AST: 24 U/L (ref 5–37)
Albumin: 4.6 g/dL (ref 3.5–5.2)
Alkaline Phosphatase: 82 U/L (ref 39–117)
Bilirubin, Direct: 0.2 mg/dL (ref 0.1–0.3)
Total Bilirubin: 1.2 mg/dL (ref 0.2–1.2)
Total Protein: 7.6 g/dL (ref 6.0–8.3)

## 2024-03-27 LAB — VITAMIN D 25 HYDROXY (VIT D DEFICIENCY, FRACTURES): VITD: 15.49 ng/mL — ABNORMAL LOW (ref 30.00–100.00)

## 2024-03-27 NOTE — Assessment & Plan Note (Signed)
Pt's PE WNL.  UTD on pap, mammo, colonoscopy, Tdap, flu.  Check labs.  Anticipatory guidance provided.  

## 2024-03-27 NOTE — Progress Notes (Signed)
" ° °  Subjective:    Patient ID: Lisa Patterson, female    DOB: 09/12/1970, 54 y.o.   MRN: [id number]  HPI CPE- UTD on mammo, pap, colonoscopy, Tdap, flu  Health Maintenance  Topic Date Due   Hepatitis B Vaccines 19-59 Average Risk (1 of 3 - 19+ 3-dose series) Never done   Pneumococcal Vaccine: 50+ Years (1 of 1 - PCV) Never done   Influenza Vaccine  06/02/2024 (Originally 10/04/2023)   Zoster Vaccines- Shingrix (1 of 2) 10/01/2024 (Originally 12/01/2022)   COVID-19 Vaccine (3 - 2025-26 season) 11/02/2024 (Originally 11/04/2023)   Mammogram  10/28/2024   Cervical Cancer Screening (HPV/Pap Cotest)  03/13/2026   Colonoscopy  03/24/2031   DTaP/Tdap/Td (8 - Td or Tdap) 02/03/2032   HPV VACCINES (No Doses Required) Completed   Hepatitis C Screening  Completed   HIV Screening  Completed   Meningococcal B Vaccine  Aged Out    Patient Care Team    Relationship Specialty Notifications Start End  Mahlon Comer BRAVO, MD PCP - General Family Medicine  12/30/13    Comment: Lavell Milian, Kelly Nest, MD Consulting Physician Obstetrics and Gynecology  10/15/16   [facility]    03/22/23       Review of Systems Patient reports no vision changes, adenopathy,fever, weight change,  persistant/recurrent hoarseness , swallowing issues, chest pain, palpitations, edema, persistant/recurrent cough, hemoptysis, dyspnea (rest/exertional/paroxysmal nocturnal), gastrointestinal bleeding (melena, rectal bleeding), abdominal pain, significant heartburn, bowel changes, GU symptoms (dysuria, hematuria, incontinence), Gyn symptoms (abnormal  bleeding, pain),  syncope, focal weakness, memory loss, numbness & tingling, skin/hair/nail changes, abnormal bruising or bleeding, anxiety, or depression.   + decreased hearing R ear    Objective:   Physical Exam General Appearance:    Alert, cooperative, no distress, appears stated age  Head:    Normocephalic, without obvious abnormality, atraumatic   Eyes:    PERRL, conjunctiva/corneas clear, EOM's intact both eyes  Ears:    Normal TM's and external ear canals, both ears  Nose:   Nares normal, septum midline, mucosa normal, no drainage    or sinus tenderness  Throat:   Lips, mucosa, and tongue normal; teeth and gums normal  Neck:   Supple, symmetrical, trachea midline, no adenopathy;    Thyroid : no enlargement/tenderness/nodules  Back:     Symmetric, no curvature, ROM normal, no CVA tenderness  Lungs:     Clear to auscultation bilaterally, respirations unlabored  Chest Wall:    No tenderness or deformity   Heart:    Regular rate and rhythm, S1 and S2 normal, no murmur, rub   or gallop  Breast Exam:    Deferred to GYN  Abdomen:     Soft, non-tender, bowel sounds active all four quadrants,    no masses, no organomegaly  Genitalia:    Deferred to GYN  Rectal:    Extremities:   Extremities normal, atraumatic, no cyanosis or edema  Pulses:   2+ and symmetric all extremities  Skin:   Skin color, texture, turgor normal, no rashes or lesions  Lymph nodes:   Cervical, supraclavicular, and axillary nodes normal  Neurologic:   CNII-XII intact, normal strength, sensation and reflexes    throughout          Assessment & Plan:    "

## 2024-03-27 NOTE — Patient Instructions (Signed)
 Follow up in 6 months to recheck blood pressure We'll notify you of your lab results and make any changes if needed Keep up the good work on healthy diet and regular exercise- you look great! Drink LOTS of fluids Limit your carb intake to help w/ hot flashes Call with any questions or concerns Stay Safe!  Stay Healthy! Happy New Year!

## 2024-03-28 LAB — TSH: TSH: 1.91 m[IU]/L

## 2024-03-31 ENCOUNTER — Ambulatory Visit: Payer: Self-pay | Admitting: Family Medicine

## 2024-03-31 DIAGNOSIS — E785 Hyperlipidemia, unspecified: Secondary | ICD-10-CM

## 2024-03-31 DIAGNOSIS — E559 Vitamin D deficiency, unspecified: Secondary | ICD-10-CM

## 2024-04-01 ENCOUNTER — Other Ambulatory Visit (HOSPITAL_BASED_OUTPATIENT_CLINIC_OR_DEPARTMENT_OTHER): Payer: Self-pay

## 2024-04-01 MED ORDER — VITAMIN D (ERGOCALCIFEROL) 1.25 MG (50000 UNIT) PO CAPS
50000.0000 [IU] | ORAL_CAPSULE | ORAL | 0 refills | Status: AC
  Filled 2024-04-01: qty 12, 84d supply, fill #0

## 2024-04-01 MED ORDER — ROSUVASTATIN CALCIUM 10 MG PO TABS
10.0000 mg | ORAL_TABLET | Freq: Every day | ORAL | 1 refills | Status: AC
  Filled 2024-04-01: qty 90, 90d supply, fill #0

## 2024-04-30 ENCOUNTER — Ambulatory Visit: Admitting: Psychology

## 2024-06-02 ENCOUNTER — Ambulatory Visit: Admitting: Psychology

## 2024-10-02 ENCOUNTER — Ambulatory Visit: Admitting: Family Medicine
